# Patient Record
Sex: Female | Born: 1990 | Race: Black or African American | Hispanic: No | Marital: Single | Smoking: Never smoker
Health system: Southern US, Community
[De-identification: ages and names within clinical notes are randomized; demographics above are authoritative.]

## PROBLEM LIST (undated history)

## (undated) ENCOUNTER — Inpatient Hospital Stay (HOSPITAL_COMMUNITY): Payer: Self-pay

## (undated) DIAGNOSIS — F32A Depression, unspecified: Secondary | ICD-10-CM

## (undated) DIAGNOSIS — M1990 Unspecified osteoarthritis, unspecified site: Secondary | ICD-10-CM

## (undated) DIAGNOSIS — F329 Major depressive disorder, single episode, unspecified: Secondary | ICD-10-CM

## (undated) DIAGNOSIS — D219 Benign neoplasm of connective and other soft tissue, unspecified: Secondary | ICD-10-CM

## (undated) DIAGNOSIS — K219 Gastro-esophageal reflux disease without esophagitis: Secondary | ICD-10-CM

## (undated) DIAGNOSIS — E739 Lactose intolerance, unspecified: Secondary | ICD-10-CM

## (undated) DIAGNOSIS — G43909 Migraine, unspecified, not intractable, without status migrainosus: Secondary | ICD-10-CM

## (undated) DIAGNOSIS — A029 Salmonella infection, unspecified: Secondary | ICD-10-CM

## (undated) DIAGNOSIS — F419 Anxiety disorder, unspecified: Secondary | ICD-10-CM

## (undated) DIAGNOSIS — J45909 Unspecified asthma, uncomplicated: Secondary | ICD-10-CM

## (undated) DIAGNOSIS — B019 Varicella without complication: Secondary | ICD-10-CM

## (undated) DIAGNOSIS — K529 Noninfective gastroenteritis and colitis, unspecified: Secondary | ICD-10-CM

## (undated) DIAGNOSIS — D649 Anemia, unspecified: Secondary | ICD-10-CM

## (undated) DIAGNOSIS — T7840XA Allergy, unspecified, initial encounter: Secondary | ICD-10-CM

## (undated) DIAGNOSIS — A048 Other specified bacterial intestinal infections: Secondary | ICD-10-CM

## (undated) HISTORY — DX: Allergy, unspecified, initial encounter: T78.40XA

## (undated) HISTORY — DX: Noninfective gastroenteritis and colitis, unspecified: K52.9

## (undated) HISTORY — DX: Varicella without complication: B01.9

## (undated) HISTORY — DX: Depression, unspecified: F32.A

## (undated) HISTORY — DX: Gastro-esophageal reflux disease without esophagitis: K21.9

## (undated) HISTORY — DX: Unspecified osteoarthritis, unspecified site: M19.90

## (undated) HISTORY — DX: Major depressive disorder, single episode, unspecified: F32.9

## (undated) HISTORY — DX: Other specified bacterial intestinal infections: A04.8

## (undated) HISTORY — DX: Anxiety disorder, unspecified: F41.9

## (undated) HISTORY — DX: Anemia, unspecified: D64.9

## (undated) HISTORY — PX: OTHER SURGICAL HISTORY: SHX169

## (undated) HISTORY — DX: Lactose intolerance, unspecified: E73.9

## (undated) HISTORY — DX: Benign neoplasm of connective and other soft tissue, unspecified: D21.9

## (undated) HISTORY — DX: Migraine, unspecified, not intractable, without status migrainosus: G43.909

## (undated) HISTORY — DX: Salmonella infection, unspecified: A02.9

## (undated) HISTORY — DX: Unspecified asthma, uncomplicated: J45.909

---
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
## 2011-06-24 ENCOUNTER — Ambulatory Visit (INDEPENDENT_AMBULATORY_CARE_PROVIDER_SITE_OTHER): Payer: BC Managed Care – PPO | Admitting: Internal Medicine

## 2011-06-24 DIAGNOSIS — J301 Allergic rhinitis due to pollen: Secondary | ICD-10-CM

## 2011-06-24 DIAGNOSIS — G4700 Insomnia, unspecified: Secondary | ICD-10-CM

## 2011-07-07 DIAGNOSIS — A048 Other specified bacterial intestinal infections: Secondary | ICD-10-CM

## 2011-07-07 HISTORY — DX: Other specified bacterial intestinal infections: A04.8

## 2011-08-31 ENCOUNTER — Ambulatory Visit (INDEPENDENT_AMBULATORY_CARE_PROVIDER_SITE_OTHER): Payer: BC Managed Care – PPO | Admitting: Internal Medicine

## 2011-08-31 DIAGNOSIS — J209 Acute bronchitis, unspecified: Secondary | ICD-10-CM

## 2011-08-31 DIAGNOSIS — F418 Other specified anxiety disorders: Secondary | ICD-10-CM | POA: Insufficient documentation

## 2011-08-31 DIAGNOSIS — A048 Other specified bacterial intestinal infections: Secondary | ICD-10-CM | POA: Insufficient documentation

## 2011-08-31 DIAGNOSIS — A749 Chlamydial infection, unspecified: Secondary | ICD-10-CM

## 2011-08-31 DIAGNOSIS — F341 Dysthymic disorder: Secondary | ICD-10-CM

## 2011-08-31 DIAGNOSIS — N771 Vaginitis, vulvitis and vulvovaginitis in diseases classified elsewhere: Secondary | ICD-10-CM

## 2011-08-31 DIAGNOSIS — J301 Allergic rhinitis due to pollen: Secondary | ICD-10-CM | POA: Insufficient documentation

## 2011-08-31 DIAGNOSIS — G4700 Insomnia, unspecified: Secondary | ICD-10-CM

## 2011-08-31 DIAGNOSIS — R490 Dysphonia: Secondary | ICD-10-CM

## 2011-08-31 LAB — POCT WET PREP WITH KOH: KOH Prep POC: NEGATIVE

## 2011-08-31 MED ORDER — TRIAZOLAM 0.25 MG PO TABS: 0.2500 mg | ORAL_TABLET | Freq: Every evening | ORAL | Status: DC | PRN

## 2011-08-31 MED ORDER — PREDNISONE 20 MG PO TABS: ORAL_TABLET | ORAL | Status: DC

## 2011-08-31 MED ORDER — METRONIDAZOLE 500 MG PO TABS: 500.0000 mg | ORAL_TABLET | Freq: Two times a day (BID) | ORAL | Status: DC

## 2011-08-31 MED ORDER — BUPROPION HCL ER (XL) 150 MG PO TB24: 150.0000 mg | ORAL_TABLET | Freq: Every day | ORAL | Status: DC

## 2011-08-31 MED ORDER — AZITHROMYCIN 250 MG PO TABS: ORAL_TABLET | ORAL | Status: DC

## 2011-08-31 NOTE — Progress Notes (Signed)
  Subjective:    Patient ID: Grace Campbell, female    DOB: 1990/11/09, 20 y.o.   MRN: [id number]  HPIShe presents tonight for several problems As a followup for hoarseness w/ singing from 12/12, she was doing well with Singulair Flonase and xyzal until one week ago when she developed a cough which is productive. She has had night sweats. There is no history of asthma but she has had to use an inhaler with a past infection. There is no sinus drainage or sore throat  She also gives a history of 2 months of vaginal discharge with an odor. She denies pelvic pain and dyspareunia and near no urinary symptoms. She has a new partner for 3 months and uses condoms. Menses are regular.  She has recently seen her therapist Lorenda Cahill and both agree that it's time to try medication again for her depression with mild anxiety.It is pertinent that her first trial with Prozac caused suicidal thoughts. This is beginning to interfere with her academic performance. She has done well with triazolam for insomnia over the past 2 months.    Review of SystemsHistory of H. Pylori infection treated a few years ago. She currently has no symptoms of reflux or dyspepsia     Objective:   Physical ExamVital signs normal TMs clear Nares clear Throat clear No anterior cervical nodes Chest with rhonchi at the left base Voice is hoarse   Pelvic exam shows a thin watery discharge at the introitus/os is slightly irritated with a yellow discharge There's a small pustule at the top of the clitoral hood with scant pus drained from the site of a hair follicle Uterus is anterior and nontender There are no adnexal masses or tenderness        Assessment & Plan:  Problem #1 lower respiratory infection with reactive airway disease Zithromax 250 pack Prednisone 332211 20 milligram/6days  Problem #2 hoarseness related to allergic rhinitis and exacerbated by problem #1 Continue singulair and flonase and xyzal  Problem #3  nonspecific vaginitis Flagyl 5oo bid 7d Gen-Probe was sent to the lab /cell [phone]  Problem #4 depression with anxiety Wellbutrin 150 XL started #30 with one refill 2 followup with me in 4-6 weeks Continue therapy  Problem #5 insomnia Triazolam refilled 0.25 mg  f/u 4-6wk

## 2011-09-02 LAB — GC/CHLAMYDIA PROBE AMP, GENITAL: GC Probe Amp, Genital: NEGATIVE

## 2011-09-04 ENCOUNTER — Telehealth: Payer: Self-pay

## 2011-09-04 MED ORDER — AZITHROMYCIN 250 MG PO TABS: ORAL_TABLET | ORAL | Status: AC

## 2011-09-04 NOTE — Telephone Encounter (Signed)
Dr Merla Riches pt would like you to call her regarding illness  [phone]

## 2011-09-04 NOTE — Progress Notes (Signed)
Addended by: Tonye Pearson on: 09/04/2011 02:50 PM   Modules accepted: Orders

## 2011-09-05 NOTE — Telephone Encounter (Signed)
PT WOULD NOT GIVE ANY INFORMATION TO CLINICAL STAFF TO RELAY TO DR DOOLITTLE .  ONLY WANTS TO SPEAK TO DR Merla Riches ONLY

## 2011-09-06 NOTE — Telephone Encounter (Signed)
No answer 3/3 4pm

## 2011-09-10 ENCOUNTER — Telehealth: Payer: Self-pay

## 2011-09-10 NOTE — Telephone Encounter (Signed)
.  [facility]    PT REQUESTING OUT OF SCHOOL NOTE FOR DATES FEB 27,28 FAXED TO HER AT [phone]   BEST PHONE [phone]

## 2011-09-11 ENCOUNTER — Ambulatory Visit (INDEPENDENT_AMBULATORY_CARE_PROVIDER_SITE_OTHER): Payer: BC Managed Care – PPO | Admitting: Internal Medicine

## 2011-09-11 ENCOUNTER — Encounter: Payer: Self-pay | Admitting: *Deleted

## 2011-09-11 VITALS — BP 112/78 | HR 74 | Temp 97.6°F | Resp 18 | Ht 66.0 in | Wt 119.0 lb

## 2011-09-11 DIAGNOSIS — J45909 Unspecified asthma, uncomplicated: Secondary | ICD-10-CM

## 2011-09-11 MED ORDER — ALBUTEROL SULFATE HFA 108 (90 BASE) MCG/ACT IN AERS: 2.0000 | INHALATION_SPRAY | Freq: Four times a day (QID) | RESPIRATORY_TRACT | Status: DC | PRN

## 2011-09-11 NOTE — Telephone Encounter (Signed)
Dr Merla Riches please advise.

## 2011-09-11 NOTE — Progress Notes (Signed)
  Subjective:    Patient ID: Grace Campbell, female    DOB: 03-06-1991, 21 y.o.   MRN: [id number]  HPI Followup from last visit Partner evaluated for Chlamydia Her symptoms of bacterial vaginosis if cleared with treatment Her reactive airway disease has also responded to treatment although she still has some episodes of shortness of breath with activity and some trouble during the night she wakes up a little short of breath. No palpitations. Mild chest pain anteriorly in the ribs when she first wakes up in the morning and takes a deep breath but no other chest pain. There is no past history of asthma   Review of SystemsNo fever or night sweats  no urinary symptoms To DC for break    Objective:   Physical Exam Vital stable Lungs clear including no wheezing no forced expiratory        Assessment & Plan:  Problem #1 reactive airway disease Use albuterol 2 puffs every 4-6 hours when necessary for the next 2-3 weeks and then followup if not well

## 2011-09-11 NOTE — Telephone Encounter (Signed)
Pt is calling to find out about her note for school  Urgent needs to be done by five pm today.

## 2012-11-06 ENCOUNTER — Ambulatory Visit (INDEPENDENT_AMBULATORY_CARE_PROVIDER_SITE_OTHER): Payer: BC Managed Care – PPO | Admitting: Physician Assistant

## 2012-11-06 VITALS — BP 117/71 | HR 70 | Temp 98.3°F | Resp 16 | Ht 66.5 in | Wt 121.2 lb

## 2012-11-06 DIAGNOSIS — B9689 Other specified bacterial agents as the cause of diseases classified elsewhere: Secondary | ICD-10-CM

## 2012-11-06 DIAGNOSIS — J309 Allergic rhinitis, unspecified: Secondary | ICD-10-CM

## 2012-11-06 DIAGNOSIS — N760 Acute vaginitis: Secondary | ICD-10-CM

## 2012-11-06 DIAGNOSIS — N898 Other specified noninflammatory disorders of vagina: Secondary | ICD-10-CM

## 2012-11-06 LAB — POCT WET PREP WITH KOH: Trichomonas, UA: NEGATIVE

## 2012-11-06 MED ORDER — FLUTICASONE PROPIONATE 50 MCG/ACT NA SUSP: 2.0000 | Freq: Every day | NASAL | Status: DC

## 2012-11-06 MED ORDER — MONTELUKAST SODIUM 10 MG PO TABS: 10.0000 mg | ORAL_TABLET | Freq: Every day | ORAL | Status: DC

## 2012-11-06 MED ORDER — METRONIDAZOLE 500 MG PO TABS: 500.0000 mg | ORAL_TABLET | Freq: Two times a day (BID) | ORAL | Status: DC

## 2012-11-06 NOTE — Patient Instructions (Addendum)
Begin using the Flonase daily.  I have sent generic Singulair to your pharmacy, hopefully insurance will cover this.  If not, let's try Zyrtec (without decongestant).  Hopefully that combination will provide good relief of allergy symptoms, if not please let me know.    Begin taking the Flagyl as directed.  DO NOT CONSUME ALCOHOL WITH THIS MEDICATION or for 48 hours after your last dose.   Bacterial Vaginosis Bacterial vaginosis (BV) is a vaginal infection where the normal balance of bacteria in the vagina is disrupted. The normal balance is then replaced by an overgrowth of certain bacteria. There are several different kinds of bacteria that can cause BV. BV is the most common vaginal infection in women of childbearing age. CAUSES   The cause of BV is not fully understood. BV develops when there is an increase or imbalance of harmful bacteria.  Some activities or behaviors can upset the normal balance of bacteria in the vagina and put women at increased risk including:  Having a new sex partner or multiple sex partners.  Douching.  Using an intrauterine device (IUD) for contraception.  It is not clear what role sexual activity plays in the development of BV. However, women that have never had sexual intercourse are rarely infected with BV. Women do not get BV from toilet seats, bedding, swimming pools or from touching objects around them.  SYMPTOMS   Grey vaginal discharge.  A fish-like odor with discharge, especially after sexual intercourse.  Itching or burning of the vagina and vulva.  Burning or pain with urination.  Some women have no signs or symptoms at all. DIAGNOSIS  Your caregiver must examine the vagina for signs of BV. Your caregiver will perform lab tests and look at the sample of vaginal fluid through a microscope. They will look for bacteria and abnormal cells (clue cells), a pH test higher than 4.5, and a positive amine test all associated with BV.  RISKS AND  COMPLICATIONS   Pelvic inflammatory disease (PID).  Infections following gynecology surgery.  Developing HIV.  Developing herpes virus. TREATMENT  Sometimes BV will clear up without treatment. However, all women with symptoms of BV should be treated to avoid complications, especially if gynecology surgery is planned. Female partners generally do not need to be treated. However, BV may spread between female sex partners so treatment is helpful in preventing a recurrence of BV.   BV may be treated with antibiotics. The antibiotics come in either pill or vaginal cream forms. Either can be used with nonpregnant or pregnant women, but the recommended dosages differ. These antibiotics are not harmful to the baby.  BV can recur after treatment. If this happens, a second round of antibiotics will often be prescribed.  Treatment is important for pregnant women. If not treated, BV can cause a premature delivery, especially for a pregnant woman who had a premature birth in the past. All pregnant women who have symptoms of BV should be checked and treated.  For chronic reoccurrence of BV, treatment with a type of prescribed gel vaginally twice a week is helpful. HOME CARE INSTRUCTIONS   Finish all medication as directed by your caregiver.  Do not have sex until treatment is completed.  Tell your sexual partner that you have a vaginal infection. They should see their caregiver and be treated if they have problems, such as a mild rash or itching.  Practice safe sex. Use condoms. Only have 1 sex partner. PREVENTION  Basic prevention steps can help reduce  the risk of upsetting the natural balance of bacteria in the vagina and developing BV:  Do not have sexual intercourse (be abstinent).  Do not douche.  Use all of the medicine prescribed for treatment of BV, even if the signs and symptoms go away.  Tell your sex partner if you have BV. That way, they can be treated, if needed, to prevent  reoccurrence. SEEK MEDICAL CARE IF:   Your symptoms are not improving after 3 days of treatment.  You have increased discharge, pain, or fever. MAKE SURE YOU:   Understand these instructions.  Will watch your condition.  Will get help right away if you are not doing well or get worse. FOR MORE INFORMATION  Division of STD Prevention (DSTDP), Centers for Disease Control and Prevention: [employer] American Social Health Association (ASHA): www.ashastd.org  Document Released: 06/22/2005 Document Revised: 09/14/2011 Document Reviewed: 12/13/2008 [facility] Patient Information 2013 [place], [place].

## 2012-11-06 NOTE — Progress Notes (Signed)
Subjective:    Patient ID: Grace Campbell, female    DOB: 1991-01-03, 22 y.o.   MRN: [id number]  HPI   Grace Campbell is a 22 yr old female here with two concerns.  (1)  Vaginal discharge - started about a month ago.  Pt relates this to taking tramadol which was prescribed by ortho.  She has since stopped taking the tramadol, now taking prednisone.  Has difficulty describing the discharge but states there is more than normal.  States she has never had yeast, but has had BV several times most recently last fall.  This feels similar to previous episodes of BV.  She is sexually active with 1 female partner.  States they use condoms 100% of the time.  This is her only form of contraception.  Has considered other forms but has not made up her mind yet about what she would like to use.  LMP last week.  No concern for STI, but would like testing today.  (2)  Allergies - pt states she has "really severe upper respiratory problems."  Allergies are bothering her this time of year, experiencing runny nose, itchy throat, cough, itchy eyes.  Currently using Claritin-D only.  States she tried to get ins approval for Singulair in the past but was not able to do so.  Has used Flonase in the past.     Review of Systems  Constitutional: Negative for fever and chills.  HENT: Positive for congestion, rhinorrhea and sneezing.   Eyes: Positive for itching.  Respiratory: Positive for cough. Negative for shortness of breath and wheezing.   Cardiovascular: Negative.   Gastrointestinal: Negative.   Genitourinary: Positive for vaginal discharge. Negative for dysuria, frequency and menstrual problem.  Allergic/Immunologic: Positive for environmental allergies.  Neurological: Negative.        Objective:   Physical Exam  Vitals reviewed. Constitutional: She is oriented to person, place, and time. She appears well-developed and well-nourished. No distress.  HENT:  Head: Normocephalic and atraumatic.  Right Ear: Tympanic  membrane and ear canal normal.  Left Ear: Tympanic membrane and ear canal normal.  Mouth/Throat: Uvula is midline, oropharynx is clear and moist and mucous membranes are normal.  Eyes: Conjunctivae are normal. No scleral icterus.  Cardiovascular: Normal rate, regular rhythm and normal heart sounds.  Exam reveals no gallop and no friction rub.   No murmur heard. Pulmonary/Chest: Effort normal. She has no wheezes. She has no rales.  Abdominal: Soft. Bowel sounds are normal. There is no tenderness.  Genitourinary: Uterus normal. There is no rash, tenderness or lesion on the right labia. There is no rash, tenderness or lesion on the left labia. Cervix exhibits no motion tenderness, no discharge and no friability. Right adnexum displays no mass, no tenderness and no fullness. Left adnexum displays no mass, no tenderness and no fullness. Vaginal discharge (thick, white) found.  Neurological: She is alert and oriented to person, place, and time.  Skin: Skin is warm and dry.  Psychiatric: She has a normal mood and affect. Her behavior is normal.     Filed Vitals:   11/06/12 1539  BP: 117/71  Pulse: 70  Temp: 98.3 F (36.8 C)  Resp: 16     Results for orders placed in visit on 11/06/12  POCT WET PREP WITH KOH      Result Value Range   Trichomonas, UA Negative     Clue Cells Wet Prep HPF POC 100%     Epithelial Wet Prep HPF POC 5-7  Yeast Wet Prep HPF POC neg     Bacteria Wet Prep HPF POC neg     RBC Wet Prep HPF POC neg     WBC Wet Prep HPF POC neg     KOH Prep POC Negative         Assessment & Plan:  Bacterial vaginosis - Plan: metroNIDAZOLE (FLAGYL) 500 MG tablet, DISCONTINUED: metroNIDAZOLE (FLAGYL) 500 MG tablet  Vaginal discharge - Plan: POCT Wet Prep with KOH, GC/Chlamydia Probe Amp  Allergic rhinitis - Plan: montelukast (SINGULAIR) 10 MG tablet, fluticasone (FLONASE) 50 MCG/ACT nasal spray, DISCONTINUED: fluticasone (FLONASE) 50 MCG/ACT nasal spray, DISCONTINUED:  montelukast (SINGULAIR) 10 MG tablet   Grace Campbell is a 22 yr old female here with bacterial vaginosis and allergic rhinitis.  Will treat BV with Flagyl x 7 days, discussed avoidance of etoh.  Will restart Flonase and Singular today.  If ins does not cover this, can try Zyrtec or Xyzal.  Encouraged pt to let me know how allergy symptoms are responding to treatment as we can make adjustments to optimize therapy.  Pt understands and is in agreement with this plan.     After medications were sent to pharmacy, pt requested a printed copy.  Phoned the pharmacy and cancelled e-scribed medications.  Printed rx provided

## 2012-11-08 LAB — GC/CHLAMYDIA PROBE AMP: GC Probe RNA: NEGATIVE

## 2012-12-12 ENCOUNTER — Ambulatory Visit (INDEPENDENT_AMBULATORY_CARE_PROVIDER_SITE_OTHER): Payer: BC Managed Care – PPO | Admitting: Family Medicine

## 2012-12-12 VITALS — BP 123/80 | HR 91 | Temp 98.0°F | Resp 16 | Ht 66.0 in | Wt 124.4 lb

## 2012-12-12 DIAGNOSIS — N912 Amenorrhea, unspecified: Secondary | ICD-10-CM

## 2012-12-12 LAB — POCT URINE PREGNANCY: Preg Test, Ur: POSITIVE

## 2012-12-12 NOTE — Progress Notes (Signed)
[facility] [address] [place] [address] [phone]  Date:  12/12/2012   Name:  Grace Campbell   DOB:  06-28-1991   MRN:  [id number]  PCP:  No primary provider on file.    Chief Complaint: Possible Pregnancy   History of Present Illness:  Grace Campbell is a 22 y.o. very pleasant female patient who presents with the following:  LMP 10/24/12.  She is here today with a positive home HCG test.   She has been pregnant twice in the past- she does not have any children.  She is currently taking singulair and tramadol, and is generally healthy   Patient Active Problem List   Diagnosis Date Noted  . Depression with anxiety 08/31/2011  . Insomnia 08/31/2011  . Allergic rhinitis due to pollen 08/31/2011  . H. pylori infection 08/31/2011    History reviewed. No pertinent past medical history.  History reviewed. No pertinent past surgical history.  History  Substance Use Topics  . Smoking status: Never Smoker   . Smokeless tobacco: Not on file  . Alcohol Use: Yes    History reviewed. No pertinent family history.  Allergies  Allergen Reactions  . Prozac (Fluoxetine Hcl) Other (See Comments)    Suicidal thoughts    Medication list has been reviewed and updated.  Current Outpatient Prescriptions on File Prior to Visit  Medication Sig Dispense Refill  . fluticasone (FLONASE) 50 MCG/ACT nasal spray Place 2 sprays into the nose daily.  16 g  12  . montelukast (SINGULAIR) 10 MG tablet Take 1 tablet (10 mg total) by mouth at bedtime.  30 tablet  3  . buPROPion (WELLBUTRIN SR) 150 MG 12 hr tablet Take 150 mg by mouth 2 (two) times daily.      . metroNIDAZOLE (FLAGYL) 500 MG tablet Take 1 tablet (500 mg total) by mouth 2 (two) times daily with a meal. DO NOT CONSUME ALCOHOL WHILE TAKING THIS MEDICATION.  14 tablet  0  . predniSONE (DELTASONE) 10 MG tablet Take 10 mg by mouth daily.       No current facility-administered medications on file prior to  visit.    Review of Systems:  As per HPI- otherwise negative.   Physical Examination: Filed Vitals:   12/12/12 1337  BP: 123/80  Pulse: 91  Temp: 98 F (36.7 C)  Resp: 16   Filed Vitals:   12/12/12 1337  Height: 5\' 6"  (1.676 m)  Weight: 124 lb 6.4 oz (56.427 kg)   Body mass index is 20.09 kg/(m^2). Ideal Body Weight: Weight in (lb) to have BMI = 25: 154.6  GEN: WDWN, NAD, Non-toxic, A & O x 3 HEENT: Atraumatic, Normocephalic. Neck supple. No masses, No LAD. Ears and Nose: No external deformity. CV: RRR, No M/G/R. No JVD. No thrill. No extra heart sounds. PULM: CTA B, no wheezes, crackles, rhonchi. No retractions. No resp. distress. No accessory muscle use. ABD: S, ND, +BS. No rebound. No HSM.  She notes mild tenderness in her lower abdomen.  No guarding EXTR: No c/c/e NEURO Normal gait.  PSYCH: Normally interactive. Conversant. Not depressed or anxious appearing.  Calm demeanor.    Results for orders placed in visit on 12/12/12  POCT URINE PREGNANCY      Result Value Range   Preg Test, Ur Positive      Assessment and Plan: Amenorrhea - Plan: POCT urine pregnancy   Grace Campbell has a positive pregnancy test today.  She notes abdominal  discomfort and has not yet had a confirmed IUP.  Referral to [facility] for further evaluation.  She is not yet sure what she plans to do with this pregnancy.   Signed Abbe Amsterdam, MD

## 2012-12-12 NOTE — Patient Instructions (Signed)
You are [redacted] weeks pregnant- your due date would be 07/31/2013 going by your last period.  Because you are having pain we need to have you seen at the [facility] to ensure that the pregnancy is in your uterus.

## 2012-12-23 ENCOUNTER — Encounter (HOSPITAL_COMMUNITY): Payer: Self-pay

## 2012-12-23 ENCOUNTER — Inpatient Hospital Stay (HOSPITAL_COMMUNITY)
Admission: AD | Admit: 2012-12-23 | Discharge: 2012-12-23 | Disposition: A | Discharge status: Home / Self Care | Payer: BC Managed Care – PPO | Source: Ambulatory Visit | Attending: Obstetrics & Gynecology | Admitting: Obstetrics & Gynecology

## 2012-12-23 ENCOUNTER — Inpatient Hospital Stay (HOSPITAL_COMMUNITY): Payer: BC Managed Care – PPO

## 2012-12-23 DIAGNOSIS — R109 Unspecified abdominal pain: Secondary | ICD-10-CM | POA: Insufficient documentation

## 2012-12-23 DIAGNOSIS — O99891 Other specified diseases and conditions complicating pregnancy: Secondary | ICD-10-CM | POA: Insufficient documentation

## 2012-12-23 DIAGNOSIS — O9989 Other specified diseases and conditions complicating pregnancy, childbirth and the puerperium: Secondary | ICD-10-CM

## 2012-12-23 DIAGNOSIS — N949 Unspecified condition associated with female genital organs and menstrual cycle: Secondary | ICD-10-CM | POA: Insufficient documentation

## 2012-12-23 LAB — URINALYSIS, ROUTINE W REFLEX MICROSCOPIC
Hgb urine dipstick: NEGATIVE
Specific Gravity, Urine: 1.025 (ref 1.005–1.030)
Urobilinogen, UA: 0.2 mg/dL (ref 0.0–1.0)
pH: 6 (ref 5.0–8.0)

## 2012-12-23 LAB — CBC
HCT: 35.8 % — ABNORMAL LOW (ref 36.0–46.0)
Hemoglobin: 12.2 g/dL (ref 12.0–15.0)
MCH: 31.7 pg (ref 26.0–34.0)
RBC: 3.85 MIL/uL — ABNORMAL LOW (ref 3.87–5.11)

## 2012-12-23 LAB — ABO/RH: ABO/RH(D): A POS

## 2012-12-23 LAB — WET PREP, GENITAL: Yeast Wet Prep HPF POC: NONE SEEN

## 2012-12-23 NOTE — MAU Provider Note (Signed)
History     [name]: [id number]  Arrival date and time: 12/23/12 1316   None     Chief Complaint  Patient presents with  . Abdominal Pain   HPI  Grace Campbell is a 22 y.o. G1P0 at [redacted]w[redacted]d who presents today with cramping and "sharp" pains. She denies any bleeding. She has not started [facility] at this time, but has an appointment with [facility] on July 10th.   No past medical history on file.  No past surgical history on file.  No family history on file.  History  Substance Use Topics  . Smoking status: Never Smoker   . Smokeless tobacco: Not on file  . Alcohol Use: Yes    Allergies:  Allergies  Allergen Reactions  . Prozac (Fluoxetine Hcl) Other (See Comments)    Suicidal thoughts    Prescriptions prior to admission  Medication Sig Dispense Refill  . buPROPion (WELLBUTRIN SR) 150 MG 12 hr tablet Take 150 mg by mouth 2 (two) times daily.      . fluticasone (FLONASE) 50 MCG/ACT nasal spray Place 2 sprays into the nose daily.  16 g  12  . metroNIDAZOLE (FLAGYL) 500 MG tablet Take 1 tablet (500 mg total) by mouth 2 (two) times daily with a meal. DO NOT CONSUME ALCOHOL WHILE TAKING THIS MEDICATION.  14 tablet  0  . montelukast (SINGULAIR) 10 MG tablet Take 1 tablet (10 mg total) by mouth at bedtime.  30 tablet  3  . predniSONE (DELTASONE) 10 MG tablet Take 10 mg by mouth daily.      . traMADol (ULTRAM) 50 MG tablet Take 50 mg by mouth every 6 (six) hours as needed for pain.        Review of Systems  Constitutional: Negative for fever and chills.  Eyes: Negative for blurred vision.  Respiratory: Negative for shortness of breath.   Cardiovascular: Negative for chest pain.  Gastrointestinal: Positive for abdominal pain ("cramps"). Negative for nausea, vomiting, diarrhea and constipation.  Genitourinary: Negative for dysuria, urgency and frequency.  Musculoskeletal: Negative for myalgias.  Neurological: Negative for dizziness and headaches.   Physical Exam   Blood pressure 133/94,  pulse 77, temperature 98.5 F (36.9 C), temperature source Oral, resp. rate 16, height 5\' 5"  (1.651 m), weight 55.43 kg (122 lb 3.2 oz), last menstrual period 10/22/2012, SpO2 100.00%.  Physical Exam  Nursing note and vitals reviewed. Constitutional: She appears well-developed and well-nourished. No distress.  Cardiovascular: Normal rate.   Respiratory: Effort normal.  GI: Soft. There is no tenderness.  Genitourinary:   External: no lesion Vagina: small amount of white discharge Cervix: pink, smooth, no CMT Uterus: AGA Adnexa: NT     MAU Course  Procedures  Results for orders placed during the hospital encounter of 12/23/12 (from the past 24 hour(s))  CBC     Status: Abnormal   Collection Time    12/23/12  1:40 PM      Result Value Range   WBC 5.7  4.0 - 10.5 K/uL   RBC 3.85 (*) 3.87 - 5.11 MIL/uL   Hemoglobin 12.2  12.0 - 15.0 g/dL   HCT 78.2 (*) 95.6 - 21.3 %   MCV 93.0  78.0 - 100.0 fL   MCH 31.7  26.0 - 34.0 pg   MCHC 34.1  30.0 - 36.0 g/dL   RDW 08.6  57.8 - 46.9 %   Platelets 292  150 - 400 K/uL  ABO/RH     Status: None  Collection Time    12/23/12  1:40 PM      Result Value Range   ABO/RH(D) A POS    HCG, QUANTITATIVE, PREGNANCY     Status: Abnormal   Collection Time    12/23/12  1:40 PM      Result Value Range   hCG, Beta Chain, Quant, S 17355 (*) <5 mIU/mL  URINALYSIS, ROUTINE W REFLEX MICROSCOPIC     Status: None   Collection Time    12/23/12  2:10 PM      Result Value Range   Color, Urine YELLOW  YELLOW   APPearance CLEAR  CLEAR   Specific Gravity, Urine 1.025  1.005 - 1.030   pH 6.0  5.0 - 8.0   Glucose, UA NEGATIVE  NEGATIVE mg/dL   Hgb urine dipstick NEGATIVE  NEGATIVE   Bilirubin Urine NEGATIVE  NEGATIVE   Ketones, ur NEGATIVE  NEGATIVE mg/dL   Protein, ur NEGATIVE  NEGATIVE mg/dL   Urobilinogen, UA 0.2  0.0 - 1.0 mg/dL   Nitrite NEGATIVE  NEGATIVE   Leukocytes, UA NEGATIVE  NEGATIVE      Results for orders placed during the hospital  encounter of 12/23/12 (from the past 24 hour(s))  CBC     Status: Abnormal   Collection Time    12/23/12  1:40 PM      Result Value Range   WBC 5.7  4.0 - 10.5 K/uL   RBC 3.85 (*) 3.87 - 5.11 MIL/uL   Hemoglobin 12.2  12.0 - 15.0 g/dL   HCT 16.1 (*) 09.6 - 04.5 %   MCV 93.0  78.0 - 100.0 fL   MCH 31.7  26.0 - 34.0 pg   MCHC 34.1  30.0 - 36.0 g/dL   RDW 40.9  81.1 - 91.4 %   Platelets 292  150 - 400 K/uL  ABO/RH     Status: None   Collection Time    12/23/12  1:40 PM      Result Value Range   ABO/RH(D) A POS    HCG, QUANTITATIVE, PREGNANCY     Status: Abnormal   Collection Time    12/23/12  1:40 PM      Result Value Range   hCG, Beta Chain, Quant, S [id number] (*) <5 mIU/mL  URINALYSIS, ROUTINE W REFLEX MICROSCOPIC     Status: None   Collection Time    12/23/12  2:10 PM      Result Value Range   Color, Urine YELLOW  YELLOW   APPearance CLEAR  CLEAR   Specific Gravity, Urine 1.025  1.005 - 1.030   pH 6.0  5.0 - 8.0   Glucose, UA NEGATIVE  NEGATIVE mg/dL   Hgb urine dipstick NEGATIVE  NEGATIVE   Bilirubin Urine NEGATIVE  NEGATIVE   Ketones, ur NEGATIVE  NEGATIVE mg/dL   Protein, ur NEGATIVE  NEGATIVE mg/dL   Urobilinogen, UA 0.2  0.0 - 1.0 mg/dL   Nitrite NEGATIVE  NEGATIVE   Leukocytes, UA NEGATIVE  NEGATIVE  WET PREP, GENITAL     Status: Abnormal   Collection Time    12/23/12  4:00 PM      Result Value Range   Yeast Wet Prep HPF POC NONE SEEN  NONE SEEN   Trich, Wet Prep NONE SEEN  NONE SEEN   Clue Cells Wet Prep HPF POC NONE SEEN  NONE SEEN   WBC, Wet Prep HPF POC FEW (*) NONE SEEN    Assessment and Plan   1. Pelvic  pain complicating pregnancy, antepartum, first trimester    First trimester danger signs reviewed Start [facility] a scheduled Return to MAU as needed  Tawnya Crook 12/23/2012, 3:07 PM

## 2012-12-23 NOTE — MAU Note (Signed)
Patient states she has been having intermittent abdominal pain that switches from side to side for about 5 weeks. Denies bleeding or discharge. Patient states she has nausea and vomiting about 2-3 times a day. Is able to keep food down.

## 2012-12-24 LAB — GC/CHLAMYDIA PROBE AMP: CT Probe RNA: NEGATIVE

## 2012-12-24 NOTE — MAU Provider Note (Signed)
Attestation of Attending Supervision of Advanced Practitioner (CNM/NP): Evaluation and management procedures were performed by the Advanced Practitioner under my supervision and collaboration. I have reviewed the Advanced Practitioner's note and chart, and I agree with the management and plan.  Jairon Ripberger H. 5:22 AM

## 2012-12-28 ENCOUNTER — Telehealth: Payer: Self-pay

## 2012-12-28 NOTE — Telephone Encounter (Signed)
Called her back- no answer, so LMOM.  I do not see where she has gone to the hospital as of yet.  I also encourage her to do so, as Amy said.  Let us know if there is anything else we can do to help.

## 2012-12-28 NOTE — Telephone Encounter (Signed)
Patient wants to talk with Dr. Patsy Lager regarding her  Pregnancy  [phone]

## 2012-12-28 NOTE — Telephone Encounter (Signed)
Dr Patsy Lager not in office today called patient, she was having bleeding today. She wants to know if this is normal I advised her it is not normal and [facility] To you [facility]

## 2013-01-04 ENCOUNTER — Other Ambulatory Visit: Payer: Self-pay | Admitting: Internal Medicine

## 2013-01-13 ENCOUNTER — Ambulatory Visit (INDEPENDENT_AMBULATORY_CARE_PROVIDER_SITE_OTHER): Payer: BC Managed Care – PPO | Admitting: Family Medicine

## 2013-01-13 VITALS — BP 100/62 | HR 66 | Temp 97.7°F | Resp 18 | Ht 66.0 in | Wt 120.0 lb

## 2013-01-13 DIAGNOSIS — J9801 Acute bronchospasm: Secondary | ICD-10-CM

## 2013-01-13 DIAGNOSIS — L738 Other specified follicular disorders: Secondary | ICD-10-CM

## 2013-01-13 DIAGNOSIS — J309 Allergic rhinitis, unspecified: Secondary | ICD-10-CM

## 2013-01-13 DIAGNOSIS — L259 Unspecified contact dermatitis, unspecified cause: Secondary | ICD-10-CM

## 2013-01-13 DIAGNOSIS — L678 Other hair color and hair shaft abnormalities: Secondary | ICD-10-CM

## 2013-01-13 DIAGNOSIS — L309 Dermatitis, unspecified: Secondary | ICD-10-CM

## 2013-01-13 DIAGNOSIS — L739 Follicular disorder, unspecified: Secondary | ICD-10-CM

## 2013-01-13 MED ORDER — ALBUTEROL SULFATE HFA 108 (90 BASE) MCG/ACT IN AERS: 2.0000 | INHALATION_SPRAY | Freq: Four times a day (QID) | RESPIRATORY_TRACT | Status: DC | PRN

## 2013-01-13 MED ORDER — TRIAMCINOLONE ACETONIDE 0.1 % EX CREA: TOPICAL_CREAM | Freq: Two times a day (BID) | CUTANEOUS | Status: DC

## 2013-01-13 MED ORDER — FLUTICASONE PROPIONATE 50 MCG/ACT NA SUSP: 2.0000 | Freq: Every day | NASAL | Status: DC

## 2013-01-13 MED ORDER — DOXYCYCLINE HYCLATE 100 MG PO TABS: 100.0000 mg | ORAL_TABLET | Freq: Two times a day (BID) | ORAL | Status: DC

## 2013-01-13 NOTE — Patient Instructions (Addendum)
Use the albuterol inhaler when needed for wheezing or shortness of breath  Do lung exercises, working on deep breathing. Consider some more regular physical exercise.  Use the nose spray 2 sprays each nostril at bedtime to try and reduce postnasal drainage  Drink plenty of liquids to keep secretions thin  Use the cream on the rash, as well as taking the doxycycline one twice daily for 10 days.

## 2013-01-13 NOTE — Progress Notes (Signed)
Subjective: 22 year old lady who attends college at [employer] She has a couple of problems. She needs medicines refilled. She has been having problems with some wheezing and coughing, especially at nighttime. Has postnasal drainage. She has been on fluticasone and albuterol. She is out of the albuterol. She also has a rash, primarily on her right arm, but on both arms and some scattered elsewhere on her body. These are little follicular bumps, solve them a little more diffusely erythematous. They itched quite a lot. None are draining. She has been treated in the past for what sounds like a folliculitis of the skin.  Objective: Rash as noted above. The her throat is clear. Neck supple without nodes. Chest clear. Heart regular without murmurs. Rashes noted.  Assessment: Eczematoid rash with some folliculitis Low-grade bronchospasm Probable allergic rhinitis with postnasal drainage  Plan: Triamcinolone cream and doxycycline for her rash. Cautioned her about excessive sun exposure while on the doxycycline. Use the inhaler on a when necessary basis. Advised using the nasal spray 2 sprays every night. Return if worse. Try and get more regular exercise and work on deep breathing. Peak flow did maximize at 470, but about 4 or 5 other trial test the lower than that as she does not seem to take very good deep breaths.

## 2013-03-02 ENCOUNTER — Ambulatory Visit (INDEPENDENT_AMBULATORY_CARE_PROVIDER_SITE_OTHER): Payer: BC Managed Care – PPO | Admitting: Family Medicine

## 2013-03-02 VITALS — BP 118/66 | HR 64 | Temp 98.1°F | Resp 18 | Ht 66.0 in | Wt 119.2 lb

## 2013-03-02 DIAGNOSIS — R0602 Shortness of breath: Secondary | ICD-10-CM

## 2013-03-02 DIAGNOSIS — R112 Nausea with vomiting, unspecified: Secondary | ICD-10-CM

## 2013-03-02 DIAGNOSIS — J9801 Acute bronchospasm: Secondary | ICD-10-CM

## 2013-03-02 DIAGNOSIS — R109 Unspecified abdominal pain: Secondary | ICD-10-CM

## 2013-03-02 DIAGNOSIS — J45909 Unspecified asthma, uncomplicated: Secondary | ICD-10-CM

## 2013-03-02 LAB — POCT UA - MICROSCOPIC ONLY: Crystals, Ur, HPF, POC: NEGATIVE

## 2013-03-02 LAB — POCT URINALYSIS DIPSTICK
Bilirubin, UA: NEGATIVE
Glucose, UA: NEGATIVE
Leukocytes, UA: NEGATIVE
Protein, UA: NEGATIVE

## 2013-03-02 LAB — POCT URINE PREGNANCY: Preg Test, Ur: NEGATIVE

## 2013-03-02 LAB — POCT WET PREP WITH KOH
KOH Prep POC: NEGATIVE
Trichomonas, UA: NEGATIVE

## 2013-03-02 MED ORDER — ALBUTEROL SULFATE HFA 108 (90 BASE) MCG/ACT IN AERS: 2.0000 | INHALATION_SPRAY | Freq: Four times a day (QID) | RESPIRATORY_TRACT | Status: DC | PRN

## 2013-03-02 MED ORDER — ONDANSETRON 8 MG PO TBDP: 8.0000 mg | ORAL_TABLET | Freq: Three times a day (TID) | ORAL | Status: DC | PRN

## 2013-03-02 MED ORDER — AZELASTINE HCL 0.1 % NA SOLN: 2.0000 | Freq: Two times a day (BID) | NASAL | Status: DC

## 2013-03-02 NOTE — Patient Instructions (Addendum)
1.  Return in two months for follow-up of shortness of breath, cough. 2.  Recommend bland diet for the next 72 hours; increase fluid intake.  3.  Return for worsening abdominal pain or persistent vomiting. 4. Use inhaler before activity.  Keep a log of how often you use inhaler.

## 2013-03-02 NOTE — Progress Notes (Signed)
Subjective:    Patient ID: Grace Campbell, female    DOB: 1991-03-10, 22 y.o.   MRN: [id number]  HPI Has had SOB at night, when lying down, and when walking to class, less cough. Nonproductive. No wheezing. Takes singular most days. Had bronchitis 1/13 and breathing has not been the same since. Has not had an inhaler for a year. Came in 7/14 for refill of albuterol, pharmacy told her that insurance would not pay. When talking about this further with patient's mother, she reports that they chose not to get Albuterol due to $40 copay.  Abdominal pain since Monday.Started vomiting on Monday, not able to keep anything down. [facility] and was told it was likely food poisoning. Has only had a salad since then, not able to keep down. Drinking gatorade, water and ginger ale. Still vomiting, but able to keep down a cracker. Drank 40 oz gatorade today. Last vomiting today at 4 am. Has urinated 4x today. Yesterday vomited 3-4 times. Tuesday first day of vomiting and vomited all day long. Abdominal pain currently mild. Started as sharp in right lower quadrant, spread across abdomen. May have had a fever Monday, felt hot, had chills, didn't take temperature.   No dysuria, ho hematuria, some back pain, usu. With menses. Started period Monday night.  No diarrhea. Last BM yesterday morning.  Not currently sexually active. No vaginal discharge or pain. Upon review of the chart, it is noted that 6/14, patient was [redacted] weeks pregnant by ultrasound.  Headaches off and on, nothing unusual for her.   Traveled to [place] and the [place] since onset of SOB.  Review of Systems    Objective:   Physical Exam  Constitutional: She is oriented to person, place, and time. She appears well-developed. No distress.  thin  HENT:  Right Ear: Tympanic membrane, external ear and ear canal normal.  Left Ear: Tympanic membrane, external ear and ear canal normal.  Nose: Mucosal edema present.  Mouth/Throat:  Oropharyngeal exudate present.  Eyes: Conjunctivae are normal. Pupils are equal, round, and reactive to light.  Neck: Normal range of motion. Neck supple. No thyromegaly present.  Cardiovascular: Normal rate and normal heart sounds.   Pulmonary/Chest: Effort normal and breath sounds normal.  Abdominal: Soft. She exhibits no mass. There is no tenderness. There is no rebound and no guarding.  Hyperactive bowel sounds.   Genitourinary: Uterus normal. There is no rash, tenderness or lesion on the right labia. There is no rash, tenderness or lesion on the left labia. Cervix exhibits no motion tenderness and no friability. Right adnexum displays no mass, no tenderness and no fullness. Left adnexum displays no mass, no tenderness and no fullness. No erythema or tenderness around the vagina.  Small amount of bloody drainage from cervix.  Lymphadenopathy:    She has no cervical adenopathy.  Neurological: She is alert and oriented to person, place, and time.  Skin: Skin is warm and dry.   Results for orders placed in visit on 03/02/13  POCT UA - MICROSCOPIC ONLY      Result Value Range   WBC, Ur, HPF, POC 0-1     RBC, urine, microscopic 0-2     Bacteria, U Microscopic 1+     Mucus, UA large     Epithelial cells, urine per micros 0-2     Crystals, Ur, HPF, POC neg     Casts, Ur, LPF, POC neg     Yeast, UA neg    POCT  URINALYSIS DIPSTICK      Result Value Range   Color, UA yellow     Clarity, UA cloudy     Glucose, UA neg     Bilirubin, UA neg     Ketones, UA trace     Spec Grav, UA 1.025     Blood, UA neg     pH, UA 7.0     Protein, UA neg     Urobilinogen, UA 1.0     Nitrite, UA neg     Leukocytes, UA Negative    POCT WET PREP WITH KOH      Result Value Range   Trichomonas, UA Negative     Clue Cells Wet Prep HPF POC 0-1     Epithelial Wet Prep HPF POC 3-8     Yeast Wet Prep HPF POC neg     Bacteria Wet Prep HPF POC 2+     RBC Wet Prep HPF POC 8-16     WBC Wet Prep HPF POC 0-3      KOH Prep POC Negative         Assessment & Plan:  Abdominal  pain, other specified site - Plan: POCT Wet Prep with KOH, GC/Chlamydia Probe Amp  Nausea with vomiting - Plan: POCT UA - Microscopic Only, POCT urinalysis dipstick, POCT urine pregnancy  1- Nausea/jvomiting/ abdominal pain- given Ondansetron 8 mg ODT in office and prescription. 2-Reactive airway disease- continue Singulair, Albuterol (printed prescription given so she can compare cost)., Follow up with Dr.Copeland in 2 months.

## 2013-03-02 NOTE — Progress Notes (Signed)
   [address]   [place], [place]  [address]   [phone]  Subjective:    Patient ID: Grace Campbell, female    DOB: 06-08-1991, 22 y.o.   MRN: [id number]  HPI This 22 y.o. female presents for evaluation of abdominal pain, nausea with vomiting.  History and physical examination obtained with Deboraha Sprang, NP.  l.   Review of Systems  Constitutional: Positive for fever and fatigue. Negative for chills and diaphoresis.  Respiratory: Positive for cough and shortness of breath. Negative for wheezing and stridor.   Cardiovascular: Negative for chest pain and leg swelling.  Gastrointestinal: Positive for nausea, vomiting and abdominal pain. Negative for diarrhea, constipation, blood in stool, abdominal distention, anal bleeding and rectal pain.  Genitourinary: Negative for dysuria, urgency, frequency, hematuria, flank pain, vaginal discharge and pelvic pain.  Neurological: Negative for dizziness, light-headedness and headaches.       Objective:   Physical Exam  CV: RRR: no m/g/r.  Lungs: CTAB; no wheezing; good air movement.  Abdomen:  BSNA; NT; ND; no HSM.  Pelvic: cervix with scant blood and vaginal discharge; no CMT; no ovarian TTP or masses/fullness.  Psych: flat affect. OP: MM moist. Neck: no cervical LAD; no thyromegaly.  Labs reviewed in detai     Assessment & Plan:  Abdominal  pain, other specified site - Plan: POCT Wet Prep with KOH, GC/Chlamydia Probe Amp  Nausea with vomiting - Plan: POCT UA - Microscopic Only, POCT urinalysis dipstick, POCT urine pregnancy  Shortness of breath  Reactive airway disease  Cough due to bronchospasm - Plan: albuterol (VENTOLIN HFA) 108 (90 BASE) MCG/ACT inhaler  1.  Abdominal pain: New. Associated with nausea, vomiting. Benign abdominal exam; pain has improved over the past few days.  RTC for acute worsening. 2.  Nausea with vomiting: New. Onset four days ago. Last vomiting > 12 hours ago. BRAT diet, hydration.  Rx for Zofran provided.  RTC  for persistent or worsening symptoms. 3.  SOB: Persistent; non-compliance with Albuterol for the past year.  Mother requesting trial of Albuterol and declined CXR.  Rx for Albuterol provided.   Rx for Astelin nasal spray also provided to treat allergic rhinitis which may be triggering asthma symptoms.  Recommend close follow-up in two months with PCP to evaluate asthma/SOB.  4.  Allergic Rhinitis: uncontrolled; rx for Astelin nasal spray provided.  May be trigger to asthma.   5. Asthma: uncontrolled due to non-compliance with Albuterol; rx for Albuterol provided; recommend close follow-up in two months with PCP to reevaluate symptoms.  Meds ordered this encounter  Medications  . albuterol (VENTOLIN HFA) 108 (90 BASE) MCG/ACT inhaler    Sig: Inhale 2 puffs into the lungs every 6 (six) hours as needed for wheezing.    Dispense:  18 g    Refill:  2  . ondansetron (ZOFRAN-ODT) 8 MG disintegrating tablet    Sig: Take 1 tablet (8 mg total) by mouth every 8 (eight) hours as needed for nausea.    Dispense:  30 tablet    Refill:  0  . azelastine (ASTELIN) 137 MCG/SPRAY nasal spray    Sig: Place 2 sprays into the nose 2 (two) times daily. Use in each nostril as directed    Dispense:  30 mL    Refill:  12

## 2013-03-04 LAB — GC/CHLAMYDIA PROBE AMP: GC Probe RNA: NEGATIVE

## 2013-04-10 ENCOUNTER — Ambulatory Visit (INDEPENDENT_AMBULATORY_CARE_PROVIDER_SITE_OTHER): Payer: BC Managed Care – PPO | Admitting: Emergency Medicine

## 2013-04-10 VITALS — BP 110/70 | HR 80 | Temp 98.4°F | Resp 20 | Ht 66.25 in | Wt 122.2 lb

## 2013-04-10 DIAGNOSIS — R509 Fever, unspecified: Secondary | ICD-10-CM

## 2013-04-10 DIAGNOSIS — J029 Acute pharyngitis, unspecified: Secondary | ICD-10-CM

## 2013-04-10 DIAGNOSIS — J309 Allergic rhinitis, unspecified: Secondary | ICD-10-CM

## 2013-04-10 DIAGNOSIS — J019 Acute sinusitis, unspecified: Secondary | ICD-10-CM

## 2013-04-10 LAB — POCT CBC
HCT, POC: 37.2 % — AB (ref 37.7–47.9)
Hemoglobin: 11.8 g/dL — AB (ref 12.2–16.2)
Lymph, poc: 2.2 (ref 0.6–3.4)
MCH, POC: 31.6 pg — AB (ref 27–31.2)
MCHC: 31.7 g/dL — AB (ref 31.8–35.4)
MCV: 99.4 fL — AB (ref 80–97)
POC Granulocyte: 3.7 (ref 2–6.9)
POC LYMPH PERCENT: 33.9 %L (ref 10–50)
POC MID %: 7.8 %M (ref 0–12)
Platelet Count, POC: 254 10*3/uL (ref 142–424)
RDW, POC: 12.7 %
WBC: 6.4 10*3/uL (ref 4.6–10.2)

## 2013-04-10 LAB — POCT INFLUENZA A/B
Influenza A, POC: NEGATIVE
Influenza B, POC: NEGATIVE

## 2013-04-10 MED ORDER — MONTELUKAST SODIUM 10 MG PO TABS: 10.0000 mg | ORAL_TABLET | Freq: Every day | ORAL | Status: DC

## 2013-04-10 MED ORDER — CEFDINIR 300 MG PO CAPS: 600.0000 mg | ORAL_CAPSULE | Freq: Every day | ORAL | Status: DC

## 2013-04-10 NOTE — Progress Notes (Addendum)
Subjective:    Patient ID: Grace Campbell, female    DOB: [birth year], 22 y.o.   MRN: [id number]  HPI HPI Comments: Grace Campbell is a 22 y.o. female who presents to the [facility] complaining of constant, gradually worsening, cough/congestion, onset 1 week ago. She states thought at first it was allergies to she tried Singulair w/out relief. She had fever and chills/sweats this AM (max fever 102), myalgias, HA. She states a yellow colored productive cough and feels congested w/rhinorrhea. She is unsure of any sick contacts. No hx of mono. No allergies besides certain fruits.     Review of Systems  Constitutional: Positive for fever and chills.  HENT: Positive for congestion and rhinorrhea. Negative for neck pain.   Respiratory: Positive for cough. Negative for shortness of breath.   Gastrointestinal: Negative for nausea, vomiting, abdominal pain and diarrhea.  Genitourinary: Positive for frequency.  Musculoskeletal: Negative for back pain.  Neurological: Negative for weakness.  All other systems reviewed and are negative.       Objective:   Physical Exam  Nursing note and vitals reviewed. Constitutional: She is oriented to person, place, and time. She appears well-developed and well-nourished. No distress.  HENT:  Head: Normocephalic and atraumatic.  Turbinates are blue and swollen. Throat is slightly red.   Eyes: EOM are normal.  Neck: Neck supple. No tracheal deviation present.  Cardiovascular: Normal rate, regular rhythm and normal heart sounds.   No murmur heard. Pulmonary/Chest: Effort normal and breath sounds normal. No respiratory distress. She has no wheezes. She has no rales.  Abdominal: There is no tenderness.  No hepatosplenomegaly  Musculoskeletal: Normal range of motion.  Lymphadenopathy:    She has cervical adenopathy (mild anterior and posterior cervical lymphadenopathy).  Neurological: She is alert and oriented to person, place, and time.  Skin: Skin is warm and  dry.  Psychiatric: She has a normal mood and affect. Her behavior is normal.   Results for orders placed in visit on 04/10/13  POCT RAPID STREP A (OFFICE)      Result Value Range   Rapid Strep A Screen Negative  Negative  POCT CBC      Result Value Range   WBC 6.4  4.6 - 10.2 K/uL   Lymph, poc 2.2  0.6 - 3.4   POC LYMPH PERCENT 33.9  10 - 50 %L   MID (cbc) 0.5  0 - 0.9   POC MID % 7.8  0 - 12 %M   POC Granulocyte 3.7  2 - 6.9   Granulocyte percent 58.3  37 - 80 %G   RBC 3.74 (*) 4.04 - 5.48 M/uL   Hemoglobin 11.8 (*) 12.2 - 16.2 g/dL   HCT, POC 40.9 (*) 81.1 - 47.9 %   MCV 99.4 (*) 80 - 97 fL   MCH, POC 31.6 (*) 27 - 31.2 pg   MCHC 31.7 (*) 31.8 - 35.4 g/dL   RDW, POC 91.4     Platelet Count, POC 254  142 - 424 K/uL   MPV 7.9  0 - 99.8 fL   Results for orders placed in visit on 04/10/13  POCT RAPID STREP A (OFFICE)      Result Value Range   Rapid Strep A Screen Negative  Negative  POCT CBC      Result Value Range   WBC 6.4  4.6 - 10.2 K/uL   Lymph, poc 2.2  0.6 - 3.4   POC LYMPH PERCENT 33.9  10 - 50 %L  MID (cbc) 0.5  0 - 0.9   POC MID % 7.8  0 - 12 %M   POC Granulocyte 3.7  2 - 6.9   Granulocyte percent 58.3  37 - 80 %G   RBC 3.74 (*) 4.04 - 5.48 M/uL   Hemoglobin 11.8 (*) 12.2 - 16.2 g/dL   HCT, POC 16.1 (*) 09.6 - 47.9 %   MCV 99.4 (*) 80 - 97 fL   MCH, POC 31.6 (*) 27 - 31.2 pg   MCHC 31.7 (*) 31.8 - 35.4 g/dL   RDW, POC 04.5     Platelet Count, POC 254  142 - 424 K/uL   MPV 7.9  0 - 99.8 fL  POCT INFLUENZA A/B      Result Value Range   Influenza A, POC Negative     Influenza B, POC Negative           Assessment & Plan:  We'll treat with Omnicef 300 mg 2 a day. I also refilled her Singulair. Her EBV titers were drawn and pending

## 2013-04-11 LAB — EPSTEIN-BARR VIRUS VCA ANTIBODY PANEL
EBV EA IgG: <5 U/mL
EBV NA IgG: >600 U/mL — ABNORMAL HIGH
EBV VCA IgG: >750 U/mL — ABNORMAL HIGH
EBV VCA IgM: 20.8 U/mL

## 2013-04-12 ENCOUNTER — Telehealth: Payer: Self-pay

## 2013-04-12 NOTE — Telephone Encounter (Signed)
SHEILA WOULD LIKE SOMEONE TO EXPLAIN TO HER WHETHER OR NOT HER DAUGHTER HAVE MONO, WAS TOLD ONE TEST SAID SHE HAD IT AND THE OTHER SAID SHE DIDN'T IS ALSO TAKING MEDICINE FOR IT. PLEASE CALL [phone]

## 2013-04-12 NOTE — Telephone Encounter (Signed)
Notes Recorded by Collene Gobble, MD on 04/11/2013 at 7:27 AM Patient shows signs of a past infection but not recently. She is advised

## 2013-04-12 NOTE — Telephone Encounter (Signed)
Mother called back advised throat still sore. She should use aleve for this, and let us know if this does not help

## 2013-08-06 ENCOUNTER — Ambulatory Visit (INDEPENDENT_AMBULATORY_CARE_PROVIDER_SITE_OTHER): Payer: BC Managed Care – PPO | Admitting: Family Medicine

## 2013-08-06 VITALS — BP 130/74 | HR 80 | Temp 98.2°F | Resp 16 | Ht 65.5 in | Wt 121.2 lb

## 2013-08-06 DIAGNOSIS — K6289 Other specified diseases of anus and rectum: Secondary | ICD-10-CM

## 2013-08-06 DIAGNOSIS — K5900 Constipation, unspecified: Secondary | ICD-10-CM

## 2013-08-06 DIAGNOSIS — K644 Residual hemorrhoidal skin tags: Secondary | ICD-10-CM

## 2013-08-06 LAB — IFOBT (OCCULT BLOOD): IFOBT: NEGATIVE

## 2013-08-06 MED ORDER — HYDROCORTISONE ACETATE 25 MG RE SUPP: 25.0000 mg | Freq: Two times a day (BID) | RECTAL | Status: DC

## 2013-08-06 NOTE — Patient Instructions (Signed)
Hemorrhoids Hemorrhoids are swollen veins around the rectum or anus. There are two types of hemorrhoids:   Internal hemorrhoids. These occur in the veins just inside the rectum. They may poke through to the outside and become irritated and painful.  External hemorrhoids. These occur in the veins outside the anus and can be felt as a painful swelling or hard lump near the anus. CAUSES  Pregnancy.   Obesity.   Constipation or diarrhea.   Straining to have a bowel movement.   Sitting for long periods on the toilet.  Heavy lifting or other activity that caused you to strain.  Anal intercourse. SYMPTOMS   Pain.   Anal itching or irritation.   Rectal bleeding.   Fecal leakage.   Anal swelling.   One or more lumps around the anus.  DIAGNOSIS  Your caregiver may be able to diagnose hemorrhoids by visual examination. Other examinations or tests that may be performed include:   Examination of the rectal area with a gloved hand (digital rectal exam).   Examination of anal canal using a small tube (scope).   A blood test if you have lost a significant amount of blood.  A test to look inside the colon (sigmoidoscopy or colonoscopy). TREATMENT Most hemorrhoids can be treated at home. However, if symptoms do not seem to be getting better or if you have a lot of rectal bleeding, your caregiver may perform a procedure to help make the hemorrhoids get smaller or remove them completely. Possible treatments include:   Placing a rubber band at the base of the hemorrhoid to cut off the circulation (rubber band ligation).   Injecting a chemical to shrink the hemorrhoid (sclerotherapy).   Using a tool to burn the hemorrhoid (infrared light therapy).   Surgically removing the hemorrhoid (hemorrhoidectomy).   Stapling the hemorrhoid to block blood flow to the tissue (hemorrhoid stapling).  HOME CARE INSTRUCTIONS   Eat foods with fiber, such as whole grains, beans,  nuts, fruits, and vegetables. Ask your doctor about taking products with added fiber in them (fibersupplements).  Increase fluid intake. Drink enough water and fluids to keep your urine clear or pale yellow.   Exercise regularly.   Go to the bathroom when you have the urge to have a bowel movement. Do not wait.   Avoid straining to have bowel movements.   Keep the anal area dry and clean. Use wet toilet paper or moist towelettes after a bowel movement.   Medicated creams and suppositories may be used or applied as directed.   Only take over-the-counter or prescription medicines as directed by your caregiver.   Take warm sitz baths for 15 20 minutes, 3 4 times a day to ease pain and discomfort.   Place ice packs on the hemorrhoids if they are tender and swollen. Using ice packs between sitz baths may be helpful.   Put ice in a plastic bag.   Place a towel between your skin and the bag.   Leave the ice on for 15 20 minutes, 3 4 times a day.   Do not use a donut-shaped pillow or sit on the toilet for long periods. This increases blood pooling and pain.  SEEK MEDICAL CARE IF:  You have increasing pain and swelling that is not controlled by treatment or medicine.  You have uncontrolled bleeding.  You have difficulty or you are unable to have a bowel movement.  You have pain or inflammation outside the area of the hemorrhoids. MAKE SURE YOU:    Understand these instructions.  Will watch your condition.  Will get help right away if you are not doing well or get worse. Document Released: 06/19/2000 Document Revised: 06/08/2012 Document Reviewed: 04/26/2012 [employer] Patient Information 2014 [employer].  

## 2013-08-06 NOTE — Progress Notes (Signed)
Subjective:    Patient ID: Grace Campbell, female    DOB: 06-May-1991, 23 y.o.   MRN: [id number]  HPI This 23 y.o. female presents for evaluation of hemorrhoidal pain.  Onset of symptoms on 07/28/13.  Suffered with constipation the week prior to onset of symptoms.  Developed soreness around rectum.  Applied cold compress.  Looked with mirror.  Swelling has improved but wants further evaluation.  Looked up hemorrhoids on Google. Started new OCP/Loestrin in the past month.  Having daily bowel movements; B.m.s were less easy.  No blood on toilet paper.  No pain with b.m. Applied Preparation H. Cooling.   No anal intercourse.  Mild anal itching.  LMP this week; still slight spotting.  Review of Systems  Constitutional: Negative for fever, chills, diaphoresis, appetite change and fatigue.  Gastrointestinal: Positive for constipation. Negative for nausea, vomiting, abdominal pain, diarrhea, blood in stool and anal bleeding.  Genitourinary: Negative for dysuria, vaginal bleeding and vaginal discharge.   Past Medical History  Diagnosis Date  . Medical history non-contributory   . Allergy   . Anemia   . Depression   . Anxiety    Allergies  Allergen Reactions  . Prozac [Fluoxetine Hcl] Other (See Comments)    Suicidal thoughts   Current Outpatient Prescriptions on File Prior to Visit  Medication Sig Dispense Refill  . albuterol (VENTOLIN HFA) 108 (90 BASE) MCG/ACT inhaler Inhale 2 puffs into the lungs every 6 (six) hours as needed for wheezing.  18 g  2  . azelastine (ASTELIN) 137 MCG/SPRAY nasal spray Place 2 sprays into the nose 2 (two) times daily. Use in each nostril as directed  30 mL  12  . fluticasone (FLONASE) 50 MCG/ACT nasal spray Place 2 sprays into the nose daily.  16 g  5  . IRON PO Take 1 tablet by mouth 2 (two) times daily.      . montelukast (SINGULAIR) 10 MG tablet Take 1 tablet (10 mg total) by mouth at bedtime.  30 tablet  11  . ondansetron (ZOFRAN-ODT) 8 MG disintegrating  tablet Take 1 tablet (8 mg total) by mouth every 8 (eight) hours as needed for nausea.  30 tablet  0   No current facility-administered medications on file prior to visit.   History   Social History  . Marital Status: Single    Spouse Name: N/A    Number of Children: N/A  . Years of Education: N/A   Occupational History  . Not on file.   Social History Main Topics  . Smoking status: Never Smoker   . Smokeless tobacco: Not on file  . Alcohol Use: Yes  . Drug Use: No  . Sexual Activity: Yes    Partners: Male   Other Topics Concern  . Not on file   Social History Narrative   Marital status: single; not dating.      Children: none      Education: [employer]      Lives: in [place]; stays on campus.      Tobacco; none      Alcohol: socially      Drugs: none      Exercise: none   History reviewed. No pertinent family history.      Objective:   Physical Exam  Nursing note and vitals reviewed. Constitutional: She appears well-developed and well-nourished. No distress.  HENT:  Head: Normocephalic and atraumatic.  Cardiovascular: Normal rate and regular rhythm.   Murmur heard. Pulmonary/Chest: Effort normal.  Abdominal: Soft. Bowel sounds are normal. She exhibits no distension. There is no tenderness. There is no rebound and no guarding.  Genitourinary: Rectal exam shows external hemorrhoid. Rectal exam shows no fissure, no mass, no tenderness and anal tone normal.  Skin: She is not diaphoretic.      Assessment & Plan:  Unspecified constipation  Hemorrhoids, external - Plan: hydrocortisone (ANUSOL-HC) 25 MG suppository, IFOBT POC (occult bld, rslt in office)  Pain, rectum  1. External hemorrhoids:  New.  Discussed etiology and treatment; continue preparation H, Tucks; rx for Anusol HC provided to use PRN.  Avoid constipation and diarrhea. No thrombosed hemorrhoids. 2.  Constipation: New.  Likely etiology to external hemorrhoid formation.  Meds ordered  this encounter  Medications  . hydrocortisone (ANUSOL-HC) 25 MG suppository    Sig: Place 1 suppository (25 mg total) rectally 2 (two) times daily.    Dispense:  12 suppository    Refill:  0   Reginia Forts, M.D.  [facility] [address] [place], [place]  [address] [phone] phone [phone] fax

## 2014-01-01 ENCOUNTER — Other Ambulatory Visit: Payer: Self-pay | Admitting: Obstetrics and Gynecology

## 2014-01-01 ENCOUNTER — Other Ambulatory Visit (HOSPITAL_COMMUNITY)
Admission: RE | Admit: 2014-01-01 | Discharge: 2014-01-01 | Disposition: A | Discharge status: Home / Self Care | Payer: BC Managed Care – PPO | Source: Ambulatory Visit | Attending: Obstetrics and Gynecology | Admitting: Obstetrics and Gynecology

## 2014-01-01 DIAGNOSIS — Z01419 Encounter for gynecological examination (general) (routine) without abnormal findings: Secondary | ICD-10-CM | POA: Insufficient documentation

## 2014-01-03 LAB — CYTOLOGY - PAP

## 2014-04-13 ENCOUNTER — Emergency Department (HOSPITAL_COMMUNITY)
Admission: EM | Admit: 2014-04-13 | Discharge: 2014-04-13 | Disposition: A | Discharge status: Home / Self Care | Payer: BC Managed Care – PPO | Source: Home / Self Care | Attending: Emergency Medicine | Admitting: Emergency Medicine

## 2014-04-13 ENCOUNTER — Encounter (HOSPITAL_COMMUNITY): Payer: Self-pay | Admitting: Emergency Medicine

## 2014-04-13 DIAGNOSIS — J029 Acute pharyngitis, unspecified: Secondary | ICD-10-CM

## 2014-04-13 DIAGNOSIS — J9801 Acute bronchospasm: Secondary | ICD-10-CM

## 2014-04-13 LAB — POCT RAPID STREP A: Streptococcus, Group A Screen (Direct): NEGATIVE

## 2014-04-13 MED ORDER — ALBUTEROL SULFATE HFA 108 (90 BASE) MCG/ACT IN AERS: 2.0000 | INHALATION_SPRAY | Freq: Four times a day (QID) | RESPIRATORY_TRACT | Status: DC | PRN

## 2014-04-13 MED ORDER — NORETHIN-ETH ESTRAD-FE BIPHAS 1 MG-10 MCG / 10 MCG PO TABS: 1.0000 | ORAL_TABLET | Freq: Every day | ORAL | Status: DC

## 2014-04-13 NOTE — ED Notes (Signed)
C/o  Sore throat since Wednesday.  Gradually getting worse.  Denies fever, n/v/d.  No otc meds taken.

## 2014-04-13 NOTE — Discharge Instructions (Signed)
You have a viral illness.   This will take 7-10 days to resolve.  For the sore throat you can use... 1. Chloroseptic spray 2. Salt water gargles 3. Tea with honey  For the body aches... 1. Naprosyn twice a day 2. Acetaminophen 1000mg  every 8 hours as needed  If your symptoms change or worsen, please come back.

## 2014-04-13 NOTE — ED Provider Notes (Signed)
[name]: [id number]     Arrival date & time 04/13/14  1927 History   First MD Initiated Contact with Patient 04/13/14 1936     Chief Complaint  Patient presents with  . Sore Throat   (Consider location/radiation/quality/duration/timing/severity/associated sxs/prior Treatment) HPI She is a 23 year old woman here for evaluation of sore throat. She states the sore throat started 2 days ago, and has gradually been worsening. It is associated with body aches, chills, subjective fever, cough, mild shortness of breath. She reports decreased appetite and some mild nausea. She denies any vomiting or diarrhea. She is tolerating fluids well. She reports multiple sick contacts with similar symptoms. She does have a history of asthma and allergies. She currently denies any wheezing.  Past Medical History  Diagnosis Date  . Medical history non-contributory   . Allergy   . Anemia   . Depression   . Anxiety    Past Surgical History  Procedure Laterality Date  . No past surgeries     History reviewed. No pertinent family history. History  Substance Use Topics  . Smoking status: Never Smoker   . Smokeless tobacco: Not on file  . Alcohol Use: Yes   OB History   Grav Para Term Preterm Abortions TAB SAB Ect Mult Living   1              Review of Systems  Constitutional: Positive for chills and appetite change.  HENT: Positive for rhinorrhea and sore throat. Negative for trouble swallowing.   Respiratory: Positive for cough and shortness of breath. Negative for wheezing.   Cardiovascular: Negative.   Gastrointestinal: Positive for nausea. Negative for vomiting, abdominal pain and diarrhea.  Musculoskeletal: Positive for myalgias.    Allergies  Prozac  Home Medications   Prior to Admission medications   Medication Sig Start Date End Date Taking? Authorizing Provider  albuterol (VENTOLIN HFA) 108 (90 BASE) MCG/ACT inhaler Inhale 2 puffs into the lungs every 6 (six) hours as needed for  wheezing. 04/13/14   Melony Overly, MD  azelastine (ASTELIN) 137 MCG/SPRAY nasal spray Place 2 sprays into the nose 2 (two) times daily. Use in each nostril as directed 03/02/13   Wardell Honour, MD  fluticasone [facility]) 50 MCG/ACT nasal spray Place 2 sprays into the nose daily. 01/13/13   Posey Boyer, MD  hydrocortisone (ANUSOL-HC) 25 MG suppository Place 1 suppository (25 mg total) rectally 2 (two) times daily. 08/06/13   Wardell Honour, MD  IRON PO Take 1 tablet by mouth 2 (two) times daily.    Historical Provider, MD  montelukast (SINGULAIR) 10 MG tablet Take 1 tablet (10 mg total) by mouth at bedtime. 04/10/13   Darlyne Russian, MD  Norethindrone-Ethinyl Estradiol-Fe Biphas (LO LOESTRIN FE) 1 MG-10 MCG / 10 MCG tablet Take 1 tablet by mouth daily. 04/13/14   Melony Overly, MD  ondansetron (ZOFRAN-ODT) 8 MG disintegrating tablet Take 1 tablet (8 mg total) by mouth every 8 (eight) hours as needed for nausea. 03/02/13   Wardell Honour, MD   BP 119/89  Pulse 80  Temp(Src) 98.8 F (37.1 C) (Oral)  Resp 12  SpO2 100%  LMP 04/08/2014 Physical Exam  Constitutional: She is oriented to person, place, and time. She appears well-developed and well-nourished. No distress.  HENT:  Head: Normocephalic and atraumatic.  Right Ear: External ear normal.  Left Ear: External ear normal.  Nose: Rhinorrhea present. No mucosal edema.  Mouth/Throat: Mucous membranes are normal. Posterior oropharyngeal erythema present.  No oropharyngeal exudate.  Neck: Neck supple.  Cardiovascular: Normal rate, regular rhythm and normal heart sounds.   No murmur heard. Pulmonary/Chest: Effort normal and breath sounds normal. No respiratory distress. She has no wheezes. She has no rales.  Lymphadenopathy:    She has no cervical adenopathy.  Neurological: She is alert and oriented to person, place, and time.  Skin: Skin is warm and dry. She is not diaphoretic.    ED Course  Procedures (including critical care time) Labs  Review Labs Reviewed  POCT RAPID STREP A ([facility] CARE ONLY)    Imaging Review No results found.   MDM   1. Viral pharyngitis   2. Cough due to bronchospasm    Rapid strep is negative. History and exam consistent with a viral process. Discussed symptomatic treatment as in the after visit summary. Reviewed reasons to return including persistent fever, vomiting, severe sore throat preventing her from drinking fluids. Provided refill of her albuterol inhaler to use as needed. Also provided a one month refill of her birth control prescription.    Melony Overly, MD 04/13/14 2013

## 2014-04-15 LAB — CULTURE, GROUP A STREP

## 2014-05-07 ENCOUNTER — Encounter (HOSPITAL_COMMUNITY): Payer: Self-pay | Admitting: Emergency Medicine

## 2014-09-05 ENCOUNTER — Ambulatory Visit (INDEPENDENT_AMBULATORY_CARE_PROVIDER_SITE_OTHER): Payer: BC Managed Care – PPO | Admitting: Physician Assistant

## 2014-09-05 VITALS — BP 108/68 | HR 75 | Temp 98.1°F | Resp 16 | Ht 66.0 in | Wt 123.0 lb

## 2014-09-05 DIAGNOSIS — K5900 Constipation, unspecified: Secondary | ICD-10-CM

## 2014-09-05 DIAGNOSIS — R110 Nausea: Secondary | ICD-10-CM

## 2014-09-05 DIAGNOSIS — N9489 Other specified conditions associated with female genital organs and menstrual cycle: Secondary | ICD-10-CM

## 2014-09-05 DIAGNOSIS — Z309 Encounter for contraceptive management, unspecified: Secondary | ICD-10-CM

## 2014-09-05 DIAGNOSIS — A499 Bacterial infection, unspecified: Secondary | ICD-10-CM

## 2014-09-05 DIAGNOSIS — N898 Other specified noninflammatory disorders of vagina: Secondary | ICD-10-CM

## 2014-09-05 DIAGNOSIS — N760 Acute vaginitis: Secondary | ICD-10-CM | POA: Diagnosis not present

## 2014-09-05 DIAGNOSIS — J4520 Mild intermittent asthma, uncomplicated: Secondary | ICD-10-CM

## 2014-09-05 DIAGNOSIS — B9689 Other specified bacterial agents as the cause of diseases classified elsewhere: Secondary | ICD-10-CM

## 2014-09-05 DIAGNOSIS — Z789 Other specified health status: Secondary | ICD-10-CM

## 2014-09-05 DIAGNOSIS — J9801 Acute bronchospasm: Secondary | ICD-10-CM

## 2014-09-05 LAB — POCT WET PREP WITH KOH
KOH PREP POC: NEGATIVE
Trichomonas, UA: NEGATIVE
Yeast Wet Prep HPF POC: NEGATIVE

## 2014-09-05 MED ORDER — ONDANSETRON 8 MG PO TBDP: 8.0000 mg | ORAL_TABLET | Freq: Three times a day (TID) | ORAL | Status: DC | PRN

## 2014-09-05 MED ORDER — METRONIDAZOLE 500 MG PO TABS: 500.0000 mg | ORAL_TABLET | Freq: Two times a day (BID) | ORAL | Status: DC

## 2014-09-05 MED ORDER — NORETHIN-ETH ESTRAD-FE BIPHAS 1 MG-10 MCG / 10 MCG PO TABS: 1.0000 | ORAL_TABLET | Freq: Every day | ORAL | Status: DC

## 2014-09-05 MED ORDER — ALBUTEROL SULFATE HFA 108 (90 BASE) MCG/ACT IN AERS: 2.0000 | INHALATION_SPRAY | Freq: Four times a day (QID) | RESPIRATORY_TRACT | Status: DC | PRN

## 2014-09-05 NOTE — Progress Notes (Signed)
Subjective:    Patient ID: Grace Campbell, female    DOB: Aug 06, 1990, 24 y.o.   MRN: [id number]  Chief Complaint  Patient presents with  . Medication Refill    BC, albuterol inhaler, zofran  . Constipation    x 2 weeks   Patient Active Problem List   Diagnosis Date Noted  . Depression with anxiety 08/31/2011  . Insomnia 08/31/2011  . Allergic rhinitis due to pollen 08/31/2011  . H. pylori infection 08/31/2011   Prior to Admission medications   Medication Sig Start Date End Date Taking? Authorizing Provider  albuterol (VENTOLIN HFA) 108 (90 BASE) MCG/ACT inhaler Inhale 2 puffs into the lungs every 6 (six) hours as needed for wheezing. 04/13/14  Yes Melony Overly, MD  IRON PO Take 1 tablet by mouth 2 (two) times daily.   Yes Historical Provider, MD  montelukast (SINGULAIR) 10 MG tablet Take 1 tablet (10 mg total) by mouth at bedtime. 04/10/13  Yes Darlyne Russian, MD  Norethindrone-Ethinyl Estradiol-Fe Biphas (LO LOESTRIN FE) 1 MG-10 MCG / 10 MCG tablet Take 1 tablet by mouth daily. 04/13/14  Yes Melony Overly, MD  ondansetron (ZOFRAN-ODT) 8 MG disintegrating tablet Take 1 tablet (8 mg total) by mouth every 8 (eight) hours as needed for nausea. 03/02/13  Yes Wardell Honour, MD   Medications, allergies, past medical history, surgical history, family history, social history and problem list reviewed and updated.  HPI  47 yof with pmh mild intermittent asthma presents for med refills, for constipation, and vaginal odor.   Med refills - Needs birth control refill. Has been on for several yrs. Needs albuterol refill. Not out yet but being proactive. States has hx mild asthma. Uses albuterol approx 3x/week. Wakes at night approx 1x/month needing rescue inhaler. Feels that she wheezes sometimes. Needs zofran refill. Uses rarely but has had intermittent nausea since pregnancy 2 yrs ago. Taking a trip for couple months upcoming so would like the medication.   Constipation - Mild for several yrs. Has  never taken anything for it. Recently increased her water intake. Does not watch her fiber intake. No blood in stool. Has approx 1-2 bms per week. Normal.   Vaginal odor - Three day h/o fishy odor. Unsure if shes had a discharge as just finished menses. No sexual activity past 4-5 months. No itching. No dyspareunia.   Review of Systems No cp, sob, fever, chills.     Objective:   Physical Exam  Constitutional: She is oriented to person, place, and time. She appears well-developed and well-nourished.  Non-toxic appearance. She does not have a sickly appearance. She does not appear ill. No distress.  BP 108/68 mmHg  Pulse 75  Temp(Src) 98.1 F (36.7 C)  Resp 16  Ht 5\' 6"  (1.676 m)  Wt 123 lb (55.792 kg)  BMI 19.86 kg/m2  SpO2 100%  LMP 09/05/2014   Eyes: Conjunctivae and EOM are normal.  Cardiovascular: Normal rate, regular rhythm and normal heart sounds.   Pulmonary/Chest: Effort normal and breath sounds normal. She has no decreased breath sounds. She has no wheezes. She has no rhonchi. She has no rales.  Abdominal: Soft. Normal appearance and bowel sounds are normal. There is no tenderness. There is no CVA tenderness.  Genitourinary: Vaginal discharge found.  Thin white dc coating vaginal wall. No odor.   Lymphadenopathy:       Right: No inguinal adenopathy present.       Left: No inguinal adenopathy present.  Neurological: She is alert and oriented to person, place, and time.  Skin: Skin is warm and dry. No rash noted.  Psychiatric: She has a normal mood and affect. Her speech is normal.   Spirometry:  FEV1: 84% FVC: 87% FEV1/FVC: 98%  Results for orders placed or performed in visit on 09/05/14  POCT Wet Prep with KOH  Result Value Ref Range   Trichomonas, UA Negative    Clue Cells Wet Prep HPF POC 1-3    Epithelial Wet Prep HPF POC 0-2    Yeast Wet Prep HPF POC neg    Bacteria Wet Prep HPF POC 2+    RBC Wet Prep HPF POC 0-1    WBC Wet Prep HPF POC 2-4    KOH Prep POC  Negative       Assessment & Plan:   25 yof with pmh mild intermittent asthma presents for med refills, for constipation, and vaginal odor.   Reactive airway disease, mild intermittent, uncomplicated - Plan: PFT PULM FXN SPIROMETRY (94010) --spirometry normal, likely still in mild intermittent range --refilled albuterol, no need for inhaled cs at this time  Vaginal odor - Plan: POCT Wet Prep with KOH, GC/Chlamydia Probe Amp --clue cells on wet prep --flagyl  Nausea without vomiting --no current nausea --refilled zofran #30 for upcoming trip   Uses birth control --refilled one year  Constipation, unspecified constipation type --increase water intake, increase fiber intake, start fiber supp agent like metamucil --rtc 2-3 months if this is not helping can possibly add osmotic agent  Julieta Gutting, PA-C Physician Assistant-Certified [facility]  09/05/2014 5:58 PM

## 2014-09-05 NOTE — Patient Instructions (Addendum)
I've refilled your birth control for one year.  Your breathing test was normal. You do not need to start a 2nd inhaler at this time. I've refilled your albuterol.  I refilled the zofran.  For the constipation, increasing water, increasing exercise, and increasing fiber intake will help. Metamucil may help. If you're still constipated in 1-2 months, please come back for further evaluation.  I'll be in contact with you with the swab results. You do have BV. Please take the flagyl twice daily for one week.   Constipation Constipation is when a person has fewer than three bowel movements a week, has difficulty having a bowel movement, or has stools that are dry, hard, or larger than normal. As people grow older, constipation is more common. If you try to fix constipation with medicines that make you have a bowel movement (laxatives), the problem may get worse. Long-term laxative use may cause the muscles of the colon to become weak. A low-fiber diet, not taking in enough fluids, and taking certain medicines may make constipation worse.  CAUSES   Certain medicines, such as antidepressants, pain medicine, iron supplements, antacids, and water pills.   Certain diseases, such as diabetes, irritable bowel syndrome (IBS), thyroid disease, or depression.   Not drinking enough water.   Not eating enough fiber-rich foods.   Stress or travel.   Lack of physical activity or exercise.   Ignoring the urge to have a bowel movement.   Using laxatives too much.  SIGNS AND SYMPTOMS   Having fewer than three bowel movements a week.   Straining to have a bowel movement.   Having stools that are hard, dry, or larger than normal.   Feeling full or bloated.   Pain in the lower abdomen.   Not feeling relief after having a bowel movement.  DIAGNOSIS  Your health care provider will take a medical history and perform a physical exam. Further testing may be done for severe constipation. Some  tests may include:  A barium enema X-ray to examine your rectum, colon, and, sometimes, your small intestine.   A sigmoidoscopy to examine your lower colon.   A colonoscopy to examine your entire colon. TREATMENT  Treatment will depend on the severity of your constipation and what is causing it. Some dietary treatments include drinking more fluids and eating more fiber-rich foods. Lifestyle treatments may include regular exercise. If these diet and lifestyle recommendations do not help, your health care provider may recommend taking over-the-counter laxative medicines to help you have bowel movements. Prescription medicines may be prescribed if over-the-counter medicines do not work.  HOME CARE INSTRUCTIONS   Eat foods that have a lot of fiber, such as fruits, vegetables, whole grains, and beans.  Limit foods high in fat and processed sugars, such as french fries, hamburgers, cookies, candies, and soda.   A fiber supplement may be added to your diet if you cannot get enough fiber from foods.   Drink enough fluids to keep your urine clear or pale yellow.   Exercise regularly or as directed by your health care provider.   Go to the restroom when you have the urge to go. Do not hold it.   Only take over-the-counter or prescription medicines as directed by your health care provider. Do not take other medicines for constipation without talking to your health care provider first.  Walker IF:   You have bright red blood in your stool.   Your constipation lasts for more than 4  days or gets worse.   You have abdominal or rectal pain.   You have thin, pencil-like stools.   You have unexplained weight loss. MAKE SURE YOU:   Understand these instructions.  Will watch your condition.  Will get help right away if you are not doing well or get worse. Document Released: 03/20/2004 Document Revised: 06/27/2013 Document Reviewed: 04/03/2013 [facility] Patient  Information 2015 [place], [place]. This information is not intended to replace advice given to you by your health care provider. Make sure you discuss any questions you have with your health care provider.

## 2014-09-07 LAB — GC/CHLAMYDIA PROBE AMP
CT Probe RNA: NEGATIVE
GC Probe RNA: NEGATIVE

## 2014-09-10 ENCOUNTER — Encounter (HOSPITAL_BASED_OUTPATIENT_CLINIC_OR_DEPARTMENT_OTHER): Payer: Self-pay | Admitting: Emergency Medicine

## 2014-09-10 DIAGNOSIS — J111 Influenza due to unidentified influenza virus with other respiratory manifestations: Secondary | ICD-10-CM | POA: Diagnosis not present

## 2014-09-10 DIAGNOSIS — R05 Cough: Secondary | ICD-10-CM | POA: Diagnosis present

## 2014-09-10 DIAGNOSIS — Z862 Personal history of diseases of the blood and blood-forming organs and certain disorders involving the immune mechanism: Secondary | ICD-10-CM | POA: Insufficient documentation

## 2014-09-10 DIAGNOSIS — Z793 Long term (current) use of hormonal contraceptives: Secondary | ICD-10-CM | POA: Insufficient documentation

## 2014-09-10 DIAGNOSIS — Z79899 Other long term (current) drug therapy: Secondary | ICD-10-CM | POA: Insufficient documentation

## 2014-09-10 DIAGNOSIS — Z8639 Personal history of other endocrine, nutritional and metabolic disease: Secondary | ICD-10-CM | POA: Insufficient documentation

## 2014-09-10 LAB — RAPID STREP SCREEN (MED CTR MEBANE ONLY): Streptococcus, Group A Screen (Direct): NEGATIVE

## 2014-09-10 NOTE — ED Notes (Signed)
Pt states that she has been vomiting and having runny nose and achy since this morning.

## 2014-09-11 ENCOUNTER — Emergency Department (HOSPITAL_BASED_OUTPATIENT_CLINIC_OR_DEPARTMENT_OTHER)
Admission: EM | Admit: 2014-09-11 | Discharge: 2014-09-11 | Disposition: A | Discharge status: Home / Self Care | Payer: BC Managed Care – PPO | Attending: Emergency Medicine | Admitting: Emergency Medicine

## 2014-09-11 DIAGNOSIS — J111 Influenza due to unidentified influenza virus with other respiratory manifestations: Secondary | ICD-10-CM

## 2014-09-11 DIAGNOSIS — R110 Nausea: Secondary | ICD-10-CM

## 2014-09-11 MED ORDER — ACETAMINOPHEN 325 MG PO TABS
650.0000 mg | ORAL_TABLET | Freq: Once | ORAL | Status: AC
  Administered 2014-09-11: 650 mg via ORAL
  Filled 2014-09-11: qty 2

## 2014-09-11 MED ORDER — HYDROCOD POLST-CHLORPHEN POLST 10-8 MG/5ML PO LQCR: 5.0000 mL | Freq: Two times a day (BID) | ORAL | Status: DC | PRN

## 2014-09-11 MED ORDER — ONDANSETRON 8 MG PO TBDP: 8.0000 mg | ORAL_TABLET | Freq: Three times a day (TID) | ORAL | Status: DC | PRN

## 2014-09-11 MED ORDER — ONDANSETRON 8 MG PO TBDP
8.0000 mg | ORAL_TABLET | Freq: Once | ORAL | Status: AC
  Administered 2014-09-11: 8 mg via ORAL

## 2014-09-11 MED ORDER — ONDANSETRON 8 MG PO TBDP
ORAL_TABLET | ORAL | Status: AC
  Filled 2014-09-11: qty 1

## 2014-09-11 NOTE — ED Provider Notes (Signed)
[name]: [id number]     Arrival date & time 09/10/14  2320 History   First MD Initiated Contact with Patient 09/11/14 0206     Chief Complaint  Patient presents with  . Flu-Like Symptoms      (Consider location/radiation/quality/duration/timing/severity/associated sxs/prior Treatment) HPI  This is a 24 year old female with a one-day history of flulike symptoms. Specifically she has had general malaise, body aches, fever, chills, rhinorrhea, cough and sore throat. Yesterday morning she had vomiting and diarrhea. The diarrhea has passed but she continues to vomit especially with paroxysms of cough. She has an inhaler but has not been using it because she has not felt the need. She was noted to have a temperature of 100.6 in the ED and was given Tylenol.  Past Medical History  Diagnosis Date  . Medical history non-contributory   . Allergy   . Anemia   . Depression   . Anxiety    Past Surgical History  Procedure Laterality Date  . No past surgeries     History reviewed. No pertinent family history. History  Substance Use Topics  . Smoking status: Never Smoker   . Smokeless tobacco: Not on file  . Alcohol Use: 0.6 oz/week    1 Glasses of wine per week   OB History    Gravida Para Term Preterm AB TAB SAB Ectopic Multiple Living   1              Review of Systems  All other systems reviewed and are negative.   Allergies  Prozac  Home Medications   Prior to Admission medications   Medication Sig Start Date End Date Taking? Authorizing Provider  albuterol (VENTOLIN HFA) 108 (90 BASE) MCG/ACT inhaler Inhale 2 puffs into the lungs every 6 (six) hours as needed for wheezing. 09/05/14   Merlinda Frederick McVeigh, PA  IRON PO Take 1 tablet by mouth 2 (two) times daily.    Historical Provider, MD  metroNIDAZOLE (FLAGYL) 500 MG tablet Take 1 tablet (500 mg total) by mouth 2 (two) times daily with a meal. DO NOT CONSUME ALCOHOL WHILE TAKING THIS MEDICATION. 09/05/14   Merlinda Frederick McVeigh, PA  montelukast  (SINGULAIR) 10 MG tablet Take 1 tablet (10 mg total) by mouth at bedtime. 04/10/13   Darlyne Russian, MD  Norethindrone-Ethinyl Estradiol-Fe Biphas (LO LOESTRIN FE) 1 MG-10 MCG / 10 MCG tablet Take 1 tablet by mouth daily. 09/05/14   Merlinda Frederick McVeigh, PA  ondansetron (ZOFRAN-ODT) 8 MG disintegrating tablet Take 1 tablet (8 mg total) by mouth every 8 (eight) hours as needed for nausea. 09/05/14   Todd M McVeigh, PA   BP 123/62 mmHg  Pulse 110  Temp(Src) 100.6 F (38.1 C) (Oral)  Resp 18  Ht 5\' 6"  (1.676 m)  Wt 120 lb (54.432 kg)  BMI 19.38 kg/m2  SpO2 98%  LMP 09/05/2014   Physical Exam  General: Well-developed, well-nourished female in no acute distress; appearance consistent with age of record HENT: normocephalic; atraumatic; pharynx normal Eyes: pupils equal, round and reactive to light; extraocular muscles intact Neck: supple; anterior cervical lymphadenopathy Heart: regular rate and rhythm Lungs: clear to auscultation bilaterally Abdomen: soft; nondistended; nontender; no masses or hepatosplenomegaly; bowel sounds present Extremities: No deformity; full range of motion; pulses normal Neurologic: Awake, alert and oriented; motor function intact in all extremities and symmetric; no facial droop Skin: Warm and dry Psychiatric: Flat affect    ED Course  Procedures (including critical care time)   MDM  Nursing notes and vitals signs, including pulse oximetry, reviewed.  Summary of this visit's results, reviewed by myself:  Labs:  Results for orders placed or performed during the hospital encounter of 09/11/14 (from the past 24 hour(s))  Rapid strep screen     Status: None   Collection Time: 09/10/14 11:29 PM  Result Value Ref Range   Streptococcus, Group A Screen (Direct) NEGATIVE NEGATIVE   Symptoms consistent with influenza which has been noted to be present in this state recently.  Shanon Rosser, MD 09/11/14 [phone]

## 2014-09-13 LAB — CULTURE, GROUP A STREP: Strep A Culture: NEGATIVE

## 2014-09-17 ENCOUNTER — Ambulatory Visit (INDEPENDENT_AMBULATORY_CARE_PROVIDER_SITE_OTHER): Payer: BC Managed Care – PPO | Admitting: Family Medicine

## 2014-09-17 ENCOUNTER — Ambulatory Visit (HOSPITAL_COMMUNITY): Admission: RE | Admit: 2014-09-17 | Payer: BC Managed Care – PPO | Source: Ambulatory Visit

## 2014-09-17 ENCOUNTER — Telehealth: Payer: Self-pay | Admitting: Family Medicine

## 2014-09-17 VITALS — BP 120/86 | HR 61 | Temp 97.7°F | Resp 16 | Ht 66.0 in | Wt 114.0 lb

## 2014-09-17 DIAGNOSIS — G44031 Episodic paroxysmal hemicrania, intractable: Secondary | ICD-10-CM

## 2014-09-17 MED ORDER — BUTALBITAL-APAP-CAFFEINE 50-325-40 MG PO TABS: 1.0000 | ORAL_TABLET | Freq: Four times a day (QID) | ORAL | Status: DC | PRN

## 2014-09-17 NOTE — Patient Instructions (Signed)

## 2014-09-17 NOTE — Progress Notes (Signed)
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 This is a worse when she's ever had  Past infrequent migraine hx  Headache made worse by movement.  Denies photophobia or vomiting.  No visual change noted.  Objective:  Lying quietly in light diminished room  HEENT: normal Neck:  Able to move chin down when holding her head in hands, no adenopathy or thyromegaly Chest:  Clear Heart:  Reg, no murmur Moving 4 extremities. Neuro intact  Assessment:  Worst headache of life following the "flu"  Plan:       ICD-9-CM ICD-10-CM   1. 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

## 2014-09-17 NOTE — Telephone Encounter (Signed)
[facility] radiology called, pt showed up and was told to go to registration, she went to registration and was told the window was closed she would need to go to the ED to register. She was very upset with this experience and refused to register or have imaging study done at [facility].   apologized and offered to attempt to have imaging done at [facility]; the patient refused, she said it would not be done today. She may not have transportation to [facility]. I informed Dr. Federico Flake.

## 2014-10-02 ENCOUNTER — Other Ambulatory Visit: Payer: Self-pay

## 2014-10-02 MED ORDER — MONTELUKAST SODIUM 10 MG PO TABS: 10.0000 mg | ORAL_TABLET | Freq: Every day | ORAL | Status: DC

## 2014-11-18 ENCOUNTER — Ambulatory Visit (INDEPENDENT_AMBULATORY_CARE_PROVIDER_SITE_OTHER): Payer: BC Managed Care – PPO | Admitting: Emergency Medicine

## 2014-11-18 VITALS — BP 118/78 | HR 54 | Temp 98.3°F | Resp 20 | Ht 66.5 in | Wt 122.2 lb

## 2014-11-18 DIAGNOSIS — H66009 Acute suppurative otitis media without spontaneous rupture of ear drum, unspecified ear: Secondary | ICD-10-CM

## 2014-11-18 DIAGNOSIS — J209 Acute bronchitis, unspecified: Secondary | ICD-10-CM

## 2014-11-18 MED ORDER — AMOXICILLIN-POT CLAVULANATE 875-125 MG PO TABS: 1.0000 | ORAL_TABLET | Freq: Two times a day (BID) | ORAL | Status: DC

## 2014-11-18 MED ORDER — PSEUDOEPHEDRINE-GUAIFENESIN ER 60-600 MG PO TB12: 1.0000 | ORAL_TABLET | Freq: Two times a day (BID) | ORAL | Status: DC

## 2014-11-18 MED ORDER — HYDROCOD POLST-CPM POLST ER 10-8 MG/5ML PO SUER: 5.0000 mL | Freq: Two times a day (BID) | ORAL | Status: DC

## 2014-11-18 NOTE — Progress Notes (Signed)
[facility] [address] [place] [address] [phone]  Date:  11/18/2014   Name:  Grace Campbell   DOB:  April 30, 1991   MRN:  [id number]  PCP:  Leandrew Koyanagi, MD    Chief Complaint: Cough; Sore Throat; and Ear Pain   History of Present Illness:  Grace Campbell is a 24 y.o. very pleasant female patient who presents with the following:  Ill with nasal congestion and mucoid drainage. Has post nasal drainage. Cough productive mucoid sputum, some wheezing treated with albuterol.   Little exertional shortness of breath Patient denies nausea or vomiting Patient denies fever or chills. Today awoke with right ear pain. No improvement with over the counter medications or other home remedies.  Denies other complaint or health concern today.   Patient Active Problem List   Diagnosis Date Noted  . Depression with anxiety 08/31/2011  . Insomnia 08/31/2011  . Allergic rhinitis due to pollen 08/31/2011  . H. pylori infection 08/31/2011    Past Medical History  Diagnosis Date  . Medical history non-contributory   . Allergy   . Anemia   . Depression   . Anxiety     Past Surgical History  Procedure Laterality Date  . No past surgeries      History  Substance Use Topics  . Smoking status: Never Smoker   . Smokeless tobacco: Not on file  . Alcohol Use: 0.6 oz/week    1 Glasses of wine per week    History reviewed. No pertinent family history.  Allergies  Allergen Reactions  . Prozac [Fluoxetine Hcl] Other (See Comments)    Suicidal thoughts    Medication list has been reviewed and updated.  Current Outpatient Prescriptions on File Prior to Visit  Medication Sig Dispense Refill  . albuterol (VENTOLIN HFA) 108 (90 BASE) MCG/ACT inhaler Inhale 2 puffs into the lungs every 6 (six) hours as needed for wheezing. 18 g 2  . IRON PO Take 1 tablet by mouth 2 (two) times daily.    . montelukast (SINGULAIR) 10 MG tablet Take 1 tablet (10 mg total)  by mouth at bedtime. 30 tablet 5  . Norethindrone-Ethinyl Estradiol-Fe Biphas (LO LOESTRIN FE) 1 MG-10 MCG / 10 MCG tablet Take 1 tablet by mouth daily. 3 Package 3  . butalbital-acetaminophen-caffeine (FIORICET) 50-325-40 MG per tablet Take 1-2 tablets by mouth every 6 (six) hours as needed for headache. (Patient not taking: Reported on 11/18/2014) 20 tablet 0  . chlorpheniramine-HYDROcodone (TUSSIONEX PENNKINETIC ER) 10-8 MG/5ML LQCR Take 5 mLs by mouth every 12 (twelve) hours as needed. (Patient not taking: Reported on 11/18/2014) 70 mL 0  . metroNIDAZOLE (FLAGYL) 500 MG tablet Take 1 tablet (500 mg total) by mouth 2 (two) times daily with a meal. DO NOT CONSUME ALCOHOL WHILE TAKING THIS MEDICATION. (Patient not taking: Reported on 11/18/2014) 14 tablet 0  . ondansetron (ZOFRAN-ODT) 8 MG disintegrating tablet Take 1 tablet (8 mg total) by mouth every 8 (eight) hours as needed for nausea or vomiting. (Patient not taking: Reported on 11/18/2014) 10 tablet 0   No current facility-administered medications on file prior to visit.    Review of Systems:  Review of Systems  Constitutional: Negative for fever, chills and fatigue.  HENT: Negative for congestion, ear pain, hearing loss, postnasal drip, rhinorrhea and sinus pressure.   Eyes: Negative for discharge and redness.  Respiratory: Negative for cough, shortness of breath and wheezing.   Cardiovascular: Negative for chest pain and  leg swelling.  Gastrointestinal: Negative for nausea, vomiting, abdominal pain, constipation and blood in stool.  Genitourinary: Negative for dysuria, urgency and frequency.  Musculoskeletal: Negative for neck stiffness.  Skin: Negative for rash.  Neurological: Negative for seizures, weakness and headaches.   Physical Examination: Filed Vitals:   11/18/14 1224  BP: 118/78  Pulse: 54  Temp: 98.3 F (36.8 C)  Resp: 20   Filed Vitals:   11/18/14 1224  Height: 5' 6.5" (1.689 m)  Weight: 122 lb 4 oz (55.452 kg)    Body mass index is 19.44 kg/(m^2). Ideal Body Weight: Weight in (lb) to have BMI = 25: 156.9  GEN: WDWN, NAD, Non-toxic, A & O x 3 HEENT: Atraumatic, Normocephalic. Neck supple. No masses, No LAD. Ears and Nose: No external deformity.  Right otitis media CV: RRR, No M/G/R. No JVD. No thrill. No extra heart sounds. PULM: CTA B, no wheezes, crackles, rhonchi. No retractions. No resp. distress. No accessory muscle use. ABD: S, NT, ND, +BS. No rebound. No HSM. EXTR: No c/c/e NEURO Normal gait.  PSYCH: Normally interactive. Conversant. Not depressed or anxious appearing.  Calm demeanor.    Assessment and Plan: 1. Acute suppurative otitis media without spontaneous rupture of ear drum, recurrence not specified, unspecified laterality   - amoxicillin-clavulanate (AUGMENTIN) 875-125 MG per tablet; Take 1 tablet by mouth 2 (two) times daily.  Dispense: 20 tablet; Refill: 0 - pseudoephedrine-guaifenesin (MUCINEX D) 60-600 MG per tablet; Take 1 tablet by mouth every 12 (twelve) hours.  Dispense: 18 tablet; Refill: 0  2. Acute bronchitis, unspecified organism   - chlorpheniramine-HYDROcodone (TUSSIONEX PENNKINETIC ER) 10-8 MG/5ML SUER; Take 5 mLs by mouth 2 (two) times daily.  Dispense: 60 mL; Refill: 0    Signed Ellison Carwin, MD

## 2015-06-03 ENCOUNTER — Encounter: Payer: Self-pay | Admitting: Internal Medicine

## 2015-06-12 ENCOUNTER — Emergency Department (HOSPITAL_COMMUNITY)
Admission: EM | Admit: 2015-06-12 | Discharge: 2015-06-12 | Disposition: A | Discharge status: Home / Self Care | Payer: BC Managed Care – PPO | Attending: Emergency Medicine | Admitting: Emergency Medicine

## 2015-06-12 ENCOUNTER — Encounter (HOSPITAL_COMMUNITY): Payer: Self-pay

## 2015-06-12 ENCOUNTER — Ambulatory Visit (INDEPENDENT_AMBULATORY_CARE_PROVIDER_SITE_OTHER): Payer: BC Managed Care – PPO | Admitting: Family Medicine

## 2015-06-12 ENCOUNTER — Ambulatory Visit (HOSPITAL_COMMUNITY)
Admission: RE | Admit: 2015-06-12 | Discharge: 2015-06-12 | Disposition: A | Discharge status: Home / Self Care | Payer: BC Managed Care – PPO | Source: Ambulatory Visit | Attending: Family Medicine | Admitting: Family Medicine

## 2015-06-12 ENCOUNTER — Telehealth: Payer: Self-pay | Admitting: Emergency Medicine

## 2015-06-12 ENCOUNTER — Telehealth: Payer: Self-pay

## 2015-06-12 ENCOUNTER — Ambulatory Visit (INDEPENDENT_AMBULATORY_CARE_PROVIDER_SITE_OTHER): Payer: BC Managed Care – PPO

## 2015-06-12 ENCOUNTER — Encounter (HOSPITAL_COMMUNITY): Payer: Self-pay | Admitting: *Deleted

## 2015-06-12 VITALS — BP 120/82 | HR 79 | Temp 98.0°F | Resp 16 | Ht 66.5 in | Wt 120.0 lb

## 2015-06-12 DIAGNOSIS — R198 Other specified symptoms and signs involving the digestive system and abdomen: Secondary | ICD-10-CM

## 2015-06-12 DIAGNOSIS — Z793 Long term (current) use of hormonal contraceptives: Secondary | ICD-10-CM | POA: Diagnosis not present

## 2015-06-12 DIAGNOSIS — Z8659 Personal history of other mental and behavioral disorders: Secondary | ICD-10-CM | POA: Insufficient documentation

## 2015-06-12 DIAGNOSIS — Z862 Personal history of diseases of the blood and blood-forming organs and certain disorders involving the immune mechanism: Secondary | ICD-10-CM | POA: Diagnosis not present

## 2015-06-12 DIAGNOSIS — Z3202 Encounter for pregnancy test, result negative: Secondary | ICD-10-CM | POA: Insufficient documentation

## 2015-06-12 DIAGNOSIS — R1031 Right lower quadrant pain: Secondary | ICD-10-CM

## 2015-06-12 DIAGNOSIS — R10829 Rebound abdominal tenderness, unspecified site: Secondary | ICD-10-CM | POA: Diagnosis not present

## 2015-06-12 DIAGNOSIS — Z79899 Other long term (current) drug therapy: Secondary | ICD-10-CM | POA: Diagnosis not present

## 2015-06-12 DIAGNOSIS — R112 Nausea with vomiting, unspecified: Secondary | ICD-10-CM

## 2015-06-12 DIAGNOSIS — R1032 Left lower quadrant pain: Secondary | ICD-10-CM

## 2015-06-12 DIAGNOSIS — K529 Noninfective gastroenteritis and colitis, unspecified: Secondary | ICD-10-CM | POA: Diagnosis not present

## 2015-06-12 LAB — POCT URINALYSIS DIP (MANUAL ENTRY)
Blood, UA: NEGATIVE
Glucose, UA: NEGATIVE
Leukocytes, UA: NEGATIVE
Nitrite, UA: NEGATIVE
Spec Grav, UA: >=1.03
Urobilinogen, UA: 0.2
pH, UA: 5.5

## 2015-06-12 LAB — COMPREHENSIVE METABOLIC PANEL
ALT: 27 U/L (ref 14–54)
AST: 18 U/L (ref 15–41)
Albumin: 4.5 g/dL (ref 3.5–5.0)
Alkaline Phosphatase: 35 U/L — ABNORMAL LOW (ref 38–126)
Anion gap: 9 (ref 5–15)
BILIRUBIN TOTAL: 1.7 mg/dL — AB (ref 0.3–1.2)
BUN: 8 mg/dL (ref 6–20)
CHLORIDE: 106 mmol/L (ref 101–111)
CO2: 23 mmol/L (ref 22–32)
CREATININE: 0.81 mg/dL (ref 0.44–1.00)
Calcium: 9.5 mg/dL (ref 8.9–10.3)
Glucose, Bld: 78 mg/dL (ref 65–99)
Potassium: 3.9 mmol/L (ref 3.5–5.1)
Sodium: 138 mmol/L (ref 135–145)
TOTAL PROTEIN: 7.6 g/dL (ref 6.5–8.1)

## 2015-06-12 LAB — CBC
HCT: 39.3 % (ref 36.0–46.0)
Hemoglobin: 13.5 g/dL (ref 12.0–15.0)
MCH: 32.7 pg (ref 26.0–34.0)
MCHC: 34.4 g/dL (ref 30.0–36.0)
MCV: 95.2 fL (ref 78.0–100.0)
PLATELETS: 259 10*3/uL (ref 150–400)
RBC: 4.13 MIL/uL (ref 3.87–5.11)
RDW: 12.6 % (ref 11.5–15.5)
WBC: 4.9 10*3/uL (ref 4.0–10.5)

## 2015-06-12 LAB — POCT CBC
Granulocyte percent: 71.3 %G (ref 37–80)
HCT, POC: 39.4 % (ref 37.7–47.9)
Hemoglobin: 13.6 g/dL (ref 12.2–16.2)
Lymph, poc: 1.2 (ref 0.6–3.4)
MCH, POC: 32.6 pg — AB (ref 27–31.2)
MCHC: 34.6 g/dL (ref 31.8–35.4)
MCV: 94.4 fL (ref 80–97)
MID (cbc): 0.3 (ref 0–0.9)
MPV: 6.7 fL (ref 0–99.8)
POC Granulocyte: 3.8 (ref 2–6.9)
POC LYMPH PERCENT: 22.6 %L (ref 10–50)
POC MID %: 6.1 %M (ref 0–12)
Platelet Count, POC: 251 10*3/uL (ref 142–424)
RBC: 4.17 M/uL (ref 4.04–5.48)
RDW, POC: 13.5 %
WBC: 5.3 10*3/uL (ref 4.6–10.2)

## 2015-06-12 LAB — POC MICROSCOPIC URINALYSIS (UMFC)

## 2015-06-12 LAB — LIPASE, BLOOD: Lipase: 23 U/L (ref 11–51)

## 2015-06-12 LAB — POC URINE PREG, ED: PREG TEST UR: NEGATIVE

## 2015-06-12 LAB — URINALYSIS, ROUTINE W REFLEX MICROSCOPIC
GLUCOSE, UA: NEGATIVE mg/dL
Hgb urine dipstick: NEGATIVE
LEUKOCYTES UA: NEGATIVE
NITRITE: NEGATIVE
PROTEIN: NEGATIVE mg/dL
Specific Gravity, Urine: >1.046 — ABNORMAL HIGH (ref 1.005–1.030)
pH: 5.5 (ref 5.0–8.0)

## 2015-06-12 LAB — POCT URINE PREGNANCY: Preg Test, Ur: NEGATIVE

## 2015-06-12 MED ORDER — ONDANSETRON 4 MG PO TBDP: ORAL_TABLET | ORAL | Status: DC

## 2015-06-12 MED ORDER — IOHEXOL 300 MG/ML  SOLN
100.0000 mL | Freq: Once | INTRAMUSCULAR | Status: AC | PRN
  Administered 2015-06-12: 100 mL via INTRAVENOUS

## 2015-06-12 MED ORDER — CIPROFLOXACIN IN D5W 400 MG/200ML IV SOLN
400.0000 mg | Freq: Once | INTRAVENOUS | Status: AC
  Administered 2015-06-12: 400 mg via INTRAVENOUS
  Filled 2015-06-12: qty 200

## 2015-06-12 MED ORDER — MORPHINE SULFATE (PF) 4 MG/ML IV SOLN
4.0000 mg | Freq: Once | INTRAVENOUS | Status: AC
  Administered 2015-06-12: 4 mg via INTRAVENOUS
  Filled 2015-06-12: qty 1

## 2015-06-12 MED ORDER — SODIUM CHLORIDE 0.9 % IV BOLUS (SEPSIS)
1000.0000 mL | Freq: Once | INTRAVENOUS | Status: AC
  Administered 2015-06-12: 1000 mL via INTRAVENOUS

## 2015-06-12 MED ORDER — METRONIDAZOLE IN NACL 5-0.79 MG/ML-% IV SOLN
500.0000 mg | Freq: Once | INTRAVENOUS | Status: AC
  Administered 2015-06-12: 500 mg via INTRAVENOUS
  Filled 2015-06-12: qty 100

## 2015-06-12 MED ORDER — CIPROFLOXACIN HCL 500 MG PO TABS: 500.0000 mg | ORAL_TABLET | Freq: Two times a day (BID) | ORAL | Status: DC

## 2015-06-12 MED ORDER — METRONIDAZOLE 500 MG PO TABS: 500.0000 mg | ORAL_TABLET | Freq: Two times a day (BID) | ORAL | Status: DC

## 2015-06-12 MED ORDER — ONDANSETRON HCL 4 MG/2ML IJ SOLN
4.0000 mg | Freq: Once | INTRAMUSCULAR | Status: AC
  Administered 2015-06-12: 4 mg via INTRAVENOUS
  Filled 2015-06-12: qty 2

## 2015-06-12 MED ORDER — OXYCODONE HCL 5 MG PO TABS: 5.0000 mg | ORAL_TABLET | ORAL | Status: DC | PRN

## 2015-06-12 NOTE — ED Notes (Signed)
Pt states she had CT scan performed today showing colitis. Pt was told to come to ED for treatment. Pt complains of nausea, vomiting, frequent diarrhea, central lower abdominal pain since 3PM yesterday.

## 2015-06-12 NOTE — Telephone Encounter (Signed)
I called patient. She is going to the ER at [facility]

## 2015-06-12 NOTE — Discharge Instructions (Signed)
Take your medicines as prescribed. Return for sudden worsening pain. Inability to eat or drink at home. Colitis Colitis is inflammation of the colon. Colitis may last a short time (acute) or it may last a long time (chronic). CAUSES This condition may be caused by:  Viruses.  Bacteria.  Reactions to medicine.  Certain autoimmune diseases, such as Crohn disease or ulcerative colitis. SYMPTOMS Symptoms of this condition include:  Diarrhea.  Passing bloody or tarry stool.  Pain.  Fever.  Vomiting.  Tiredness (fatigue).  Weight loss.  Bloating.  Sudden increase in abdominal pain.  Having fewer bowel movements than usual. DIAGNOSIS This condition is diagnosed with a stool test or a blood test. You may also have other tests, including X-rays, a CT scan, or a colonoscopy. TREATMENT Treatment may include:  Resting the bowel. This involves not eating or drinking for a period of time.  Fluids that are given through an IV tube.  Medicine for pain and diarrhea.  Antibiotic medicines.  Cortisone medicines.  Surgery. HOME CARE INSTRUCTIONS Eating and Drinking  Follow instructions from your health care provider about eating or drinking restrictions.  Drink enough fluid to keep your urine clear or pale yellow.  Work with a dietitian to determine which foods cause your condition to flare up.  Avoid foods that cause flare-ups.  Eat a well-balanced diet. Medicines  Take over-the-counter and prescription medicines only as told by your health care provider.  If you were prescribed an antibiotic medicine, take it as told by your health care provider. Do not stop taking the antibiotic even if you start to feel better. General Instructions  Keep all follow-up visits as told by your health care provider. This is important. SEEK MEDICAL CARE IF:  Your symptoms do not go away.  You develop new symptoms. SEEK IMMEDIATE MEDICAL CARE IF:  You have a fever that does not  go away with treatment.  You develop chills.  You have extreme weakness, fainting, or dehydration.  You have repeated vomiting.  You develop severe pain in your abdomen.  You pass bloody or tarry stool.   This information is not intended to replace advice given to you by your health care provider. Make sure you discuss any questions you have with your health care provider.   Document Released: 07/30/2004 Document Revised: 03/13/2015 Document Reviewed: 10/15/2014 Elsevier Interactive Patient Education [employer].

## 2015-06-12 NOTE — Telephone Encounter (Signed)
Thank you so much for taking care of this Grace Campbell

## 2015-06-12 NOTE — ED Provider Notes (Signed)
[name]: [id number]     Arrival date & time 06/12/15  1934 History   First MD Initiated Contact with Patient 06/12/15 2054     Chief Complaint  Patient presents with  . Abdominal Pain  . Diarrhea     (Consider location/radiation/quality/duration/timing/severity/associated sxs/prior Treatment) Patient is a 24 y.o. female presenting with abdominal pain and diarrhea. The history is provided by the patient.  Abdominal Pain Pain location:  LLQ, RLQ and suprapubic Pain quality: aching, sharp and shooting   Pain radiates to:  Does not radiate Pain severity:  Moderate Onset quality:  Sudden Duration:  2 days Timing:  Constant Progression:  Worsening Chronicity:  New Relieved by:  Nothing Worsened by:  Nothing tried Ineffective treatments:  None tried Associated symptoms: diarrhea, nausea and vomiting   Associated symptoms: no chest pain, no chills, no dysuria, no fever and no shortness of breath   Diarrhea Associated symptoms: abdominal pain and vomiting   Associated symptoms: no arthralgias, no chills, no fever, no headaches and no myalgias    24 yo F with a chief complaint of lower abdominal pain. This been going on for a couple days. Patient has had vomiting and diarrhea with it. Went to see her PCP and had an outpatient CT scan that was concerning for colitis. Patient was then sent here for further evaluation. Patient since that episode is been feeling mildly better. Has been able to tolerate by mouth at home.  Past Medical History  Diagnosis Date  . Medical history non-contributory   . Allergy   . Anemia   . Depression   . Anxiety    Past Surgical History  Procedure Laterality Date  . No past surgeries     No family history on file. Social History  Substance Use Topics  . Smoking status: Never Smoker   . Smokeless tobacco: None  . Alcohol Use: 0.6 oz/week    1 Glasses of wine per week   OB History    Gravida Para Term Preterm AB TAB SAB Ectopic Multiple Living   1               Review of Systems  Constitutional: Negative for fever and chills.  HENT: Negative for congestion and rhinorrhea.   Eyes: Negative for redness and visual disturbance.  Respiratory: Negative for shortness of breath and wheezing.   Cardiovascular: Negative for chest pain and palpitations.  Gastrointestinal: Positive for nausea, vomiting, abdominal pain and diarrhea.  Genitourinary: Negative for dysuria and urgency.  Musculoskeletal: Negative for myalgias and arthralgias.  Skin: Negative for pallor and wound.  Neurological: Negative for dizziness and headaches.      Allergies  Prozac and Ibuprofen  Home Medications   Prior to Admission medications   Medication Sig Start Date End Date Taking? Authorizing Provider  albuterol (VENTOLIN HFA) 108 (90 BASE) MCG/ACT inhaler Inhale 2 puffs into the lungs every 6 (six) hours as needed for wheezing. 09/05/14  Yes Todd McVeigh, PA  clindamycin (CLEOCIN T) 1 % external solution Apply 1 application topically every morning. 06/03/15  Yes Historical Provider, MD  montelukast (SINGULAIR) 10 MG tablet Take 1 tablet (10 mg total) by mouth at bedtime. 10/02/14  Yes Todd McVeigh, PA  Norethindrone-Ethinyl Estradiol-Fe Biphas (LO LOESTRIN FE) 1 MG-10 MCG / 10 MCG tablet Take 1 tablet by mouth daily. 09/05/14  Yes Todd McVeigh, PA  Tretinoin, Facial Wrinkles, (TRETINOIN, EMOLLIENT,) 0.05 % CREA Apply 1 application topically at bedtime. 06/03/15  Yes Historical Provider, MD  ciprofloxacin (CIPRO)  500 MG tablet Take 1 tablet (500 mg total) by mouth 2 (two) times daily. One po bid x 7 days 06/12/15   Deno Etienne, DO  metroNIDAZOLE (FLAGYL) 500 MG tablet Take 1 tablet (500 mg total) by mouth 2 (two) times daily. One po bid x 7 days 06/12/15   Deno Etienne, DO  ondansetron (ZOFRAN ODT) 4 MG disintegrating tablet 4mg  ODT q4 hours prn nausea/vomit 06/12/15   Deno Etienne, DO  oxyCODONE (ROXICODONE) 5 MG immediate release tablet Take 1 tablet (5 mg total) by mouth every 4  (four) hours as needed for severe pain. 06/12/15   Deno Etienne, DO   BP 119/83 mmHg  Pulse 73  Temp(Src) 98.4 F (36.9 C) (Oral)  Resp 18  Ht 5\' 6"  (1.676 m)  Wt 120 lb (54.432 kg)  BMI 19.38 kg/m2  SpO2 100%  LMP 05/31/2015 Physical Exam  Constitutional: She is oriented to person, place, and time. She appears well-developed and well-nourished. No distress.  HENT:  Head: Normocephalic and atraumatic.  Eyes: EOM are normal. Pupils are equal, round, and reactive to light.  Neck: Normal range of motion. Neck supple.  Cardiovascular: Normal rate and regular rhythm.  Exam reveals no gallop and no friction rub.   No murmur heard. Pulmonary/Chest: Effort normal. She has no wheezes. She has no rales.  Abdominal: Soft. She exhibits no distension. There is tenderness (mild lower). There is no rebound and no guarding.  Musculoskeletal: She exhibits no edema or tenderness.  Neurological: She is alert and oriented to person, place, and time.  Skin: Skin is warm and dry. She is not diaphoretic.  Psychiatric: She has a normal mood and affect. Her behavior is normal.  Nursing note and vitals reviewed.   ED Course  Procedures (including critical care time) Labs Review Labs Reviewed  COMPREHENSIVE METABOLIC PANEL - Abnormal; Notable for the following:    Alkaline Phosphatase 35 (*)    Total Bilirubin 1.7 (*)    All other components within normal limits  URINALYSIS, ROUTINE W REFLEX MICROSCOPIC (NOT AT [facility]) - Abnormal; Notable for the following:    Specific Gravity, Urine >1.046 (*)    Bilirubin Urine SMALL (*)    Ketones, ur >80 (*)    All other components within normal limits  LIPASE, BLOOD  CBC  POC URINE PREG, ED    Imaging Review Dg Abd 1 View  06/12/2015  CLINICAL DATA:  Abdominal pain. EXAM: ABDOMEN - 1 VIEW COMPARISON:  06/15/2009 . FINDINGS: Soft tissue structures are unremarkable. Several air-filled loops of slightly prominent small bowel are noted. There is mild small bowel  wall fold thickening. Active small-bowel pathology including enteritis could present in this fashion. Continued follow-up exam suggested to demonstrate clearing and to exclude developing bowel obstruction. Colonic gas pattern is unremarkable. No free air. No acute bony abnormality. Pelvic calcifications consistent phleboliths . IMPRESSION: Several air-filled loops of slightly prominent small bowel are noted. There is mild small bowel wall fold thickening. Active small bowel pathology including enteritis could present in this fashion. Continued follow-up exam suggested to demonstrate clearing and to exclude developing bowel obstruction. Electronically Signed   By: Marcello Moores  Register   On: 06/12/2015 10:14   Ct Abdomen Pelvis W Contrast  06/12/2015  CLINICAL DATA:  Bilateral lower abdominal pain, nausea and vomiting for 1 day. EXAM: CT ABDOMEN AND PELVIS WITH CONTRAST TECHNIQUE: Multidetector CT imaging of the abdomen and pelvis was performed using the standard protocol following bolus administration of intravenous contrast. CONTRAST:  154mL OMNIPAQUE IOHEXOL 300 MG/ML  SOLN COMPARISON:  None. FINDINGS: Lower chest: The lung bases are clear. No pleural effusion or pulmonary nodule. The heart is normal in size. No pericardial effusion. The distal esophagus is grossly normal. Hepatobiliary: No focal hepatic lesions or intrahepatic biliary dilatation. The gallbladder is normal. No common bile duct dilatation. Pancreas: No mass, inflammation or ductal dilatation. Spleen: Normal size.  No focal lesions. Adrenals/Urinary Tract: The adrenal glands and kidneys are normal. Stomach/Bowel: The stomach, duodenum, and small bowel are unremarkable. No inflammatory changes, mass lesions or obstructive findings. Severe inflammatory or infectious process involving the upper aspect of the ascending colon, the transverse colon and the upper aspect of the descending colon. The sigmoid colon rectum appear normal. The terminal ileum is  normal. The appendix is normal. Vascular/Lymphatic: No mesenteric or retroperitoneal mass or adenopathy. Scattered lymph nodes are noted. Reproductive: There is a large uterine fibroid measuring 8 x 5.5 cm. The ovaries are normal. Other: No pelvic mass or adenopathy. Small amount of free pelvic fluid. No abdominal wall hernia or subcutaneous lesions. Musculoskeletal: No significant bony findings. IMPRESSION: 1. Severe inflammatory or infectious colitis. This is mainly involving the transverse colon but also the adjacent aspects of the ascending and descending colon. 2. Scattered mesenteric and retroperitoneal lymph nodes but no mass or adenopathy. 3. Large fibroid involving the posterior myometrium. Electronically Signed   By: Marijo Sanes M.D.   On: 06/12/2015 16:31   I have personally reviewed and evaluated these images and lab results as part of my medical decision-making.   EKG Interpretation None      MDM   Final diagnoses:  Colitis    24 yo F with a chief complaint of lower abdominal pain. Benign abdominal exam. CT scan with colitis. Offered outpatient therapy will give the patient prescription for Roxicodone as well as Zofran Cipro and Flagyl. Discussed that the patient needs to follow-up with her PCP and likely GI for colonoscopy post resolution to evaluate for the possibility of Crohn's or ulcerative colitis.  11:29 PM:  I have discussed the diagnosis/risks/treatment options with the patient and family and believe the pt to be eligible for discharge home to follow-up with PCP. We also discussed returning to the ED immediately if new or worsening sx occur. We discussed the sx which are most concerning (e.g., sudden worsening pain, fever, inability to tolerate by mouth) that necessitate immediate return. Medications administered to the patient during their visit and any new prescriptions provided to the patient are listed below.  Medications given during this visit Medications  sodium  chloride 0.9 % bolus 1,000 mL (1,000 mLs Intravenous New Bag/Given 06/12/15 2119)  ciprofloxacin (CIPRO) IVPB 400 mg (400 mg Intravenous New Bag/Given 06/12/15 2228)  metroNIDAZOLE (FLAGYL) IVPB 500 mg (0 mg Intravenous Stopped 06/12/15 2228)  morphine 4 MG/ML injection 4 mg (4 mg Intravenous Given 06/12/15 2119)  ondansetron (ZOFRAN) injection 4 mg (4 mg Intravenous Given 06/12/15 2119)    New Prescriptions   CIPROFLOXACIN (CIPRO) 500 MG TABLET    Take 1 tablet (500 mg total) by mouth 2 (two) times daily. One po bid x 7 days   METRONIDAZOLE (FLAGYL) 500 MG TABLET    Take 1 tablet (500 mg total) by mouth 2 (two) times daily. One po bid x 7 days   ONDANSETRON (ZOFRAN ODT) 4 MG DISINTEGRATING TABLET    4mg  ODT q4 hours prn nausea/vomit   OXYCODONE (ROXICODONE) 5 MG IMMEDIATE RELEASE TABLET    Take 1 tablet (  5 mg total) by mouth every 4 (four) hours as needed for severe pain.    The patient appears reasonably screen and/or stabilized for discharge and I doubt any other medical condition or other [facility] requiring further screening, evaluation, or treatment in the ED at this time prior to discharge.      Deno Etienne, DO 06/12/15 2329

## 2015-06-12 NOTE — Telephone Encounter (Signed)
Pt was sent for a ct scan and mom is calling stating that she is in a lot of pain and is there something that can be called in   Best number to call mom is [phone]

## 2015-06-12 NOTE — Telephone Encounter (Signed)
Spoke with mother, then patient, then patient's father, then ED Charge nurse.  Patient has severe colitis. Advised to go to the ED at [facility] for additional evaluation, treatment and pain management.  IMPRESSION: 1. Severe inflammatory or infectious colitis. This is mainly involving the transverse colon but also the adjacent aspects of the ascending and descending colon. 2. Scattered mesenteric and retroperitoneal lymph nodes but no mass or adenopathy. 3. Large fibroid involving the posterior myometrium.

## 2015-06-12 NOTE — Progress Notes (Signed)
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  Pain is crampy.    Has had pain before but not as severe.  There was blood noticed with diarrhea on tissue.  No  Known fever.  Has had constipation in past week.  She notes some polyuria  Objective:  NAD BP 120/82 mmHg  Pulse 79  Temp(Src) 98 F (36.7 C) (Oral)  Resp 16  Ht 5' 6.5" (1.689 m)  Wt 120 lb (54.432 kg)  BMI 19.08 kg/m2  SpO2 99%  LMP 05/31/2015 HEENT: Unremarkable with normal mucous membranes, hearing, EOM Neck: Supple no adenopathy or thyromegaly Chest: Clear Heart: Regular without murmur or gallop Abdomen: Scaphoid, soft, diffusely tender but more tender just below the umbilicus in the midline, some guarding and mild rebound Skin: No rash Extremities: No edema  Results for orders placed or performed in visit on 06/12/15  POCT CBC  Result Value Ref Range   WBC 5.3 4.6 - 10.2 K/uL   Lymph, poc 1.2 0.6 - 3.4   POC LYMPH PERCENT 22.6 10 - 50 %L   MID (cbc) 0.3 0 - 0.9   POC MID % 6.1 0 - 12 %M   POC Granulocyte 3.8 2 - 6.9   Granulocyte percent 71.3 37 - 80 %G   RBC 4.17 4.04 - 5.48 M/uL   Hemoglobin 13.6 12.2 - 16.2 g/dL   HCT, POC 39.4 37.7 - 47.9 %   MCV 94.4 80 - 97 fL   MCH, POC 32.6 (A) 27 - 31.2 pg   MCHC 34.6 31.8 - 35.4 g/dL   RDW, POC 13.5 %   Platelet Count, POC 251 142 - 424 K/uL   MPV 6.7 0 - 99.8 fL  POCT urinalysis dipstick  Result Value Ref Range   Color, UA yellow yellow   Clarity, UA clear clear   Glucose, UA negative negative   Bilirubin, UA small (A) negative   Ketones, POC UA >= (160) (A) negative   Spec Grav, UA >=1.030    Blood, UA negative negative   pH, UA 5.5    Protein Ur, POC trace (A) negative   Urobilinogen, UA 0.2    Nitrite, UA Negative Negative   Leukocytes, UA Negative Negative  POCT Microscopic Urinalysis ([facility])  Result Value Ref Range   WBC,UR,HPF,POC None None WBC/hpf   RBC,UR,HPF,POC None None RBC/hpf   Bacteria Few (A) None, Too numerous to count   Mucus Present (A) Absent   Epithelial Cells, UR Per Microscopy Few (A) None, Too numerous to count cells/hpf   [facility] reading (PRIMARY) by  Dr. Joseph Art:  KUB-->. Some dilated loops of bowel but no definite explanation for the acute abdominal pain.  Assessment: This may represent an acute appendicitis is developing. Because of this, we need to get his CT scans morning.  Plan: Proceed with CT scan of abdomen and pelvis with rule out appendectomy protocol. If the CT is benign, we can proceed with some Bentyl, Zofran, clear liquids. Otherwise, she will need to be seen in the emergency room.  Signed, Carola Frost.D.

## 2015-06-12 NOTE — Patient Instructions (Signed)
You will need to arrive at [facility] at 3:45pm for outpatient CT abd/pelvis. Register at first floor radiology. If you want to eat something you will need to eat before 12:00 pm. Nothing to eat or drink after 12:00 pm  Oral Contrast: 1st bottle: 2:00pm 2nd bottle: 3pm

## 2015-06-12 NOTE — ED Notes (Signed)
Pt, being sent by Urgent Care, c/o bilateral lower abdominal pain and n/v/d starting this morning.  CT abdomen resulted severe colitis.

## 2015-06-13 ENCOUNTER — Telehealth: Payer: Self-pay | Admitting: Family Medicine

## 2015-06-13 ENCOUNTER — Telehealth: Payer: Self-pay | Admitting: *Deleted

## 2015-06-13 ENCOUNTER — Ambulatory Visit (INDEPENDENT_AMBULATORY_CARE_PROVIDER_SITE_OTHER): Payer: BC Managed Care – PPO | Admitting: Gastroenterology

## 2015-06-13 ENCOUNTER — Telehealth: Payer: Self-pay | Admitting: Internal Medicine

## 2015-06-13 ENCOUNTER — Other Ambulatory Visit: Payer: BC Managed Care – PPO

## 2015-06-13 ENCOUNTER — Encounter: Payer: Self-pay | Admitting: Gastroenterology

## 2015-06-13 VITALS — BP 110/62 | HR 68 | Ht 66.0 in | Wt 121.8 lb

## 2015-06-13 DIAGNOSIS — K529 Noninfective gastroenteritis and colitis, unspecified: Secondary | ICD-10-CM

## 2015-06-13 DIAGNOSIS — A09 Infectious gastroenteritis and colitis, unspecified: Secondary | ICD-10-CM | POA: Diagnosis not present

## 2015-06-13 MED ORDER — NA SULFATE-K SULFATE-MG SULF 17.5-3.13-1.6 GM/177ML PO SOLN: 1.0000 | Freq: Once | ORAL | Status: DC

## 2015-06-13 NOTE — Telephone Encounter (Signed)
Referral placed.

## 2015-06-13 NOTE — Telephone Encounter (Signed)
Pt mom called stating pt was told by Dr. Tyrone Nine to call Vel[facility] Heckler or [facility] today for Chron's evaluation.  NCM read Dr Miachel Roux note that states pt is to follow up with PCP for GI referral.  NCM explained to pt mom and she apologized for misunderstanding.  No other NCM needs stated.

## 2015-06-13 NOTE — Progress Notes (Signed)
HPI: This is a   very pleasant 24 year old woman   who was referred to me by Leandrew Koyanagi, MD  to evaluate  abnormal colon, abdominal pain .    Chief complaint is abnormal colon, abdominal pain  Felt tight in abd 2 months ago.  2 days ago tightness in abd, then gnawing pain.    1 day ago diarrhea yesterday.  Takes smooth move, tea for constipation.  Usually will go daily or every other day.  Does not have loose stools.  Generally pretty constipated.  Overall stable weight.   She tells me she has had stomach flu at least once a year. And that she was told she had food poisoning 2 or 3 times last year with vomiting illness.  She has been on doxycycline for about 3 weeks now for skin infection. No eye issues, rashes.  She tends to be constipated a lot.   CT scan IV and PO contrast 06/12/2015 . Severe inflammatory or infectious colitis. This is mainly involving the transverse colon but also the adjacent aspects of the ascending and descending colon.2. Scattered mesenteric and retroperitoneal lymph nodes but no mass or adenopathy. 3. Large fibroid involving the posterior myometrium.  Labs 06/11/2016: cbc normal, cmet normal except Tbili 1.7   Review of systems: Pertinent positive and negative review of systems were noted in the above HPI section. Complete review of systems was performed and was otherwise normal.   Past Medical History  Diagnosis Date  . Medical history non-contributory   . Allergy   . Anemia   . Depression   . Anxiety     Past Surgical History  Procedure Laterality Date  . No past surgeries      Current Outpatient Prescriptions  Medication Sig Dispense Refill  . albuterol (VENTOLIN HFA) 108 (90 BASE) MCG/ACT inhaler Inhale 2 puffs into the lungs every 6 (six) hours as needed for wheezing. 18 g 2  . ciprofloxacin (CIPRO) 500 MG tablet Take 1 tablet (500 mg total) by mouth 2 (two) times daily. One po bid x 7 days 14 tablet 0  . clindamycin (CLEOCIN  T) 1 % external solution Apply 1 application topically every morning.  11  . metroNIDAZOLE (FLAGYL) 500 MG tablet Take 1 tablet (500 mg total) by mouth 2 (two) times daily. One po bid x 7 days 14 tablet 0  . montelukast (SINGULAIR) 10 MG tablet Take 1 tablet (10 mg total) by mouth at bedtime. 30 tablet 5  . Norethindrone-Ethinyl Estradiol-Fe Biphas (LO LOESTRIN FE) 1 MG-10 MCG / 10 MCG tablet Take 1 tablet by mouth daily. 3 Package 3  . ondansetron (ZOFRAN ODT) 4 MG disintegrating tablet 4mg  ODT q4 hours prn nausea/vomit 20 tablet 0  . oxyCODONE (ROXICODONE) 5 MG immediate release tablet Take 1 tablet (5 mg total) by mouth every 4 (four) hours as needed for severe pain. 20 tablet 0  . Tretinoin, Facial Wrinkles, (TRETINOIN, EMOLLIENT,) 0.05 % CREA Apply 1 application topically at bedtime.     No current facility-administered medications for this visit.    Allergies as of 06/13/2015 - Review Complete 06/13/2015  Allergen Reaction Noted  . Prozac [fluoxetine hcl] Other (See Comments) 08/31/2011  . Ibuprofen Other (See Comments) 06/12/2015    History reviewed. No pertinent family history.  Social History   Social History  . Marital Status: Single    Spouse Name: N/A  . Number of Children: N/A  . Years of Education: N/A   Occupational History  . Not  on file.   Social History Main Topics  . Smoking status: Never Smoker   . Smokeless tobacco: Never Used  . Alcohol Use: 0.6 oz/week    1 Glasses of wine per week  . Drug Use: No  . Sexual Activity: No     Comment: No sex past 4-5 months   Other Topics Concern  . Not on file   Social History Narrative   Marital status: single; not dating.      Children: none      Education: [employer]      Lives: in [place]; stays on campus.      Tobacco; none      Alcohol: socially      Drugs: none      Exercise: none     Physical Exam: BP 110/62 mmHg  Pulse 68  Ht 5\' 6"  (1.676 m)  Wt 121 lb 12.8 oz (55.248 kg)  BMI 19.67  kg/m2  LMP 05/31/2015 Constitutional: generally well-appearing Psychiatric: alert and oriented x3 Eyes: extraocular movements intact Mouth: oral pharynx moist, no lesions Neck: supple no lymphadenopathy Cardiovascular: heart regular rate and rhythm Lungs: clear to auscultation bilaterally Abdomen: soft, nontender, nondistended, no obvious ascites, no peritoneal signs, normal bowel sounds Extremities: no lower extremity edema bilaterally Skin: no lesions on visible extremities   Assessment and plan: 24 y.o. female with  abdominal pain, abnormal: CT scan  Seems most likely she has an infectious colitis here. She was started on antibiotics, doxycycline, about 3 weeks ago. Perhaps she has C. difficile. We will get stool testing for that. She will continue her previously prescribed antibiotics for now. I do think it is important given the severity of her colon abnormality to undergo colonoscopy but I would like to wait until her acute illness subsides, we'll plan for 5-6 weeks from now. Certainly if she worsens or fails to improve in the next 3-5 days as I expect that we could speed this up. She has a large fibroid in her uterus, 8 cm, I recommended she follow-up with her gynecologist for this.   Owens Loffler, MD [facility] 06/13/2015, 2:21 PM  Cc: Leandrew Koyanagi, MD

## 2015-06-13 NOTE — Telephone Encounter (Signed)
patient mom left message with answering service stating that patient was seen in the ED and was told to that she needs to see a GI doctor today. Dr. Hilarie Fredrickson is Doc of Day. patient mom states that patient was seen for colotis. Please advise as to scheduling.

## 2015-06-13 NOTE — Telephone Encounter (Signed)
Referral made already.  

## 2015-06-13 NOTE — Telephone Encounter (Signed)
Pt mom called stating that she needs a referral done for a Gastro 06/13/15 Her daughter was seen at the ER and they suggested she have a referral done today with [facility] or [facility] for a colonoscopy.

## 2015-06-13 NOTE — Telephone Encounter (Signed)
Referrals

## 2015-06-13 NOTE — Telephone Encounter (Signed)
Pt was seen in the ER, CT scan was done. See results:  IMPRESSION: 1. Severe inflammatory or infectious colitis. This is mainly involving the transverse colon but also the adjacent aspects of the ascending and descending colon. 2. Scattered mesenteric and retroperitoneal lymph nodes but no mass or adenopathy. 3. Large fibroid involving the posterior myometrium  Pts mother states they were told to follow-up with GI today. We are on unassigned call at [facility] in December. Dr. Hilarie Fredrickson as doc of the day please advise.

## 2015-06-13 NOTE — Telephone Encounter (Signed)
Patients dad Grace Campbell calling requesting his daughter to be referred to [facility] for colotis. Per dad their office is requesting a referral from [place] where patient was seen yesterday 06/12/15. He stated she needs this to be done asap so she can be seen at [facility]. Any questions please call Johnny at [phone]

## 2015-06-13 NOTE — Patient Instructions (Signed)
Stay hydrated. You will be set up for a colonoscopy in 5-6 weeks for abnormal colon, diarrhea. Stay on the antibiotics you were given previously. You should be feeling better in 3-5 days, if not please call. Follow up with OB for your large fibroid.

## 2015-06-14 LAB — CLOSTRIDIUM DIFFICILE BY PCR: Toxigenic C. Difficile by PCR: NOT DETECTED

## 2015-06-17 ENCOUNTER — Telehealth: Payer: Self-pay | Admitting: Internal Medicine

## 2015-06-17 NOTE — Telephone Encounter (Signed)
Pt was wondering if Dr. Sharlet Salina can work her in, she was refer from Dr. Ardis Hughs in GI, pt has information in her colon and he want her to be see sooner that the 07/15/15. Please advise.   Phone # [phone]

## 2015-06-18 ENCOUNTER — Telehealth: Payer: Self-pay | Admitting: Gastroenterology

## 2015-06-18 NOTE — Telephone Encounter (Signed)
Pt has been notified to advance her diet as tolerated.  She will call if she has any concerns or problems as she begins to eat her normal foods.

## 2015-06-18 NOTE — Telephone Encounter (Signed)
Pt aware.

## 2015-06-18 NOTE — Telephone Encounter (Signed)
I would recommend to keep the January appointment. Unfortunately with the holidays we would not be able to move her up much. Upon review of her chart her only medical problems are GI and she is already seeing the GI specialist. If she is having problems sooner she can call her GI doctor or her old PCP.

## 2015-06-18 NOTE — Telephone Encounter (Signed)
Pt notified that the oxy was not prescribed by Dr Ardis Hughs and she will call the original prescriber

## 2015-07-07 HISTORY — PX: COLONOSCOPY W/ BIOPSIES: SHX1374

## 2015-07-15 ENCOUNTER — Ambulatory Visit: Payer: BC Managed Care – PPO | Admitting: Internal Medicine

## 2015-07-16 ENCOUNTER — Ambulatory Visit (AMBULATORY_SURGERY_CENTER): Payer: BC Managed Care – PPO | Admitting: Gastroenterology

## 2015-07-16 ENCOUNTER — Encounter: Payer: Self-pay | Admitting: Gastroenterology

## 2015-07-16 VITALS — BP 128/90 | HR 70 | Temp 98.3°F | Resp 27 | Ht 66.0 in | Wt 121.0 lb

## 2015-07-16 DIAGNOSIS — R197 Diarrhea, unspecified: Secondary | ICD-10-CM | POA: Diagnosis not present

## 2015-07-16 DIAGNOSIS — R933 Abnormal findings on diagnostic imaging of other parts of digestive tract: Secondary | ICD-10-CM | POA: Diagnosis not present

## 2015-07-16 DIAGNOSIS — R194 Change in bowel habit: Secondary | ICD-10-CM

## 2015-07-16 DIAGNOSIS — R1084 Generalized abdominal pain: Secondary | ICD-10-CM | POA: Diagnosis present

## 2015-07-16 MED ORDER — SODIUM CHLORIDE 0.9 % IV SOLN: 500.0000 mL | INTRAVENOUS | Status: DC

## 2015-07-16 NOTE — Progress Notes (Signed)
Called to room to assist during endoscopic procedure.  Patient ID and intended procedure confirmed with present staff. Received instructions for my participation in the procedure from the performing physician.  

## 2015-07-16 NOTE — Progress Notes (Signed)
Pt stable to RR 

## 2015-07-16 NOTE — Patient Instructions (Signed)
YOU HAD AN ENDOSCOPIC PROCEDURE TODAY AT THE [facility]:   Refer to the procedure report that was given to you for any specific questions about what was found during the examination.  If the procedure report does not answer your questions, please call your gastroenterologist to clarify.  If you requested that your care partner not be given the details of your procedure findings, then the procedure report has been included in a sealed envelope for you to review at your convenience later.  YOU SHOULD EXPECT: Some feelings of bloating in the abdomen. Passage of more gas than usual.  Walking can help get rid of the air that was put into your GI tract during the procedure and reduce the bloating. If you had a lower endoscopy (such as a colonoscopy or flexible sigmoidoscopy) you may notice spotting of blood in your stool or on the toilet paper. If you underwent a bowel prep for your procedure, you may not have a normal bowel movement for a few days.  Please Note:  You might notice some irritation and congestion in your nose or some drainage.  This is from the oxygen used during your procedure.  There is no need for concern and it should clear up in a day or so.  SYMPTOMS TO REPORT IMMEDIATELY:   Following lower endoscopy (colonoscopy or flexible sigmoidoscopy):  Excessive amounts of blood in the stool  Significant tenderness or worsening of abdominal pains  Swelling of the abdomen that is new, acute  Fever of 100F or higher   For urgent or emergent issues, a gastroenterologist can be reached at any hour by calling [phone].   DIET: Your first meal following the procedure should be a small meal and then it is ok to progress to your normal diet. Heavy or fried foods are harder to digest and may make you feel nauseous or bloated.  Likewise, meals heavy in dairy and vegetables can increase bloating.  Drink plenty of fluids but you should avoid alcoholic beverages for 24  hours.  ACTIVITY:  You should plan to take it easy for the rest of today and you should NOT DRIVE or use heavy machinery until tomorrow (because of the sedation medicines used during the test).    FOLLOW UP: Our staff will call the number listed on your records the next business day following your procedure to check on you and address any questions or concerns that you may have regarding the information given to you following your procedure. If we do not reach you, we will leave a message.  However, if you are feeling well and you are not experiencing any problems, there is no need to return our call.  We will assume that you have returned to your regular daily activities without incident.  If any biopsies were taken you will be contacted by phone or by letter within the next 1-3 weeks.  Please call us at [phone] if you have not heard about the biopsies in 3 weeks.    SIGNATURES/CONFIDENTIALITY: You and/or your care partner have signed paperwork which will be entered into your electronic medical record.  These signatures attest to the fact that that the information above on your After Visit Summary has been reviewed and is understood.  Full responsibility of the confidentiality of this discharge information lies with you and/or your care-partner. 

## 2015-07-16 NOTE — Progress Notes (Signed)
Stable to RR 

## 2015-07-16 NOTE — Op Note (Signed)
[facility]  [address] [place], [address]   COLONOSCOPY PROCEDURE REPORT  PATIENT: Grace, Campbell  MR#: [id number] BIRTHDATE: 08/04/1990 , 24  yrs. old GENDER: female ENDOSCOPIST: Milus Banister, MD REFERRED [id number] Laney Pastor, M.D. PROCEDURE DATE:  07/16/2015 PROCEDURE:   Colonoscopy, diagnostic and Colonoscopy with biopsy First Screening Colonoscopy - Avg.  risk and is 50 yrs.  old or older - No.  Prior Negative Screening - Now for repeat screening. N/A  History of Adenoma - Now for follow-up colonoscopy & has been > or = to 3 yrs.  N/A  Polyps removed today? Yes ASA CLASS:   Class I INDICATIONS:abd pain, change in bowels, very abnormal colon on CT (severe inflammation in transverse), also large uterine fibroid. MEDICATIONS: Monitored anesthesia care, Propofol 200 mg IV, and lidocaine 40mg  IV  DESCRIPTION OF PROCEDURE:   After the risks benefits and alternatives of the procedure were thoroughly explained, informed consent was obtained.  The digital rectal exam revealed no abnormalities of the rectum.   The LB [id number]  endoscope was introduced through the anus and advanced to the terminal ileum which was intubated for a short distance. No adverse events experienced.   The quality of the prep was excellent.  The instrument was then slowly withdrawn as the colon was fully examined. Estimated blood loss is zero unless otherwise noted in this procedure report.   COLON FINDINGS: The terminal ileum was normal.  The colonic mucosa was normal throughout, was randomly biopsied and sent to pathology. Retroflexed views revealed no abnormalities. The time to cecum = 2.5 Withdrawal time = 5.9   The scope was withdrawn and the procedure completed. COMPLICATIONS: There were no immediate complications.  ENDOSCOPIC IMPRESSION: The terminal ileum was normal.  The colonic mucosa was normal throughout, was randomly biopsied and sent to  pathology  RECOMMENDATIONS: Await final pathology from colon biopsies.  The large (8cm) uterine fibroid may be pressing on the colon, causing altered bowel habits.   eSigned:  Milus Banister, MD 07/16/2015 3:56 PM

## 2015-07-16 NOTE — Progress Notes (Signed)
Pt's mother reported that her daughter has a large fibroid tumor attache in the back of her uterus.  Pt's mother said pt's OBGYN said she was concerned about pt having a colonoscopy d/t tumor.  I asked the pt about this and she said she thought her OBGYN did not think it would be a problem.  I explained this to Dr. Ardis Hughs and he said he did not thnk this would be a problem with performing the colonoscopy.  I asked pt id okay to report to pt's mother what Dr. Ardis Hughs said.  She said ok to speak with her mother. maw

## 2015-07-17 ENCOUNTER — Telehealth: Payer: Self-pay | Admitting: *Deleted

## 2015-07-17 NOTE — Telephone Encounter (Signed)
  Follow up Call-  Call back number 07/16/2015  Post procedure Call Back phone  # [phone] cell  Permission to leave phone message Yes     Patient questions:  Message box is full.

## 2015-07-24 ENCOUNTER — Encounter: Payer: Self-pay | Admitting: Gastroenterology

## 2015-07-30 ENCOUNTER — Ambulatory Visit: Payer: BC Managed Care – PPO | Admitting: Internal Medicine

## 2015-08-11 ENCOUNTER — Ambulatory Visit (INDEPENDENT_AMBULATORY_CARE_PROVIDER_SITE_OTHER): Payer: BC Managed Care – PPO | Admitting: Physician Assistant

## 2015-08-11 VITALS — BP 118/70 | Temp 97.9°F | Resp 16 | Ht 66.5 in | Wt 120.6 lb

## 2015-08-11 DIAGNOSIS — G44219 Episodic tension-type headache, not intractable: Secondary | ICD-10-CM

## 2015-08-11 DIAGNOSIS — E860 Dehydration: Secondary | ICD-10-CM | POA: Diagnosis not present

## 2015-08-11 DIAGNOSIS — R208 Other disturbances of skin sensation: Secondary | ICD-10-CM

## 2015-08-11 LAB — POCT URINALYSIS DIP (MANUAL ENTRY)
Bilirubin, UA: NEGATIVE
Blood, UA: NEGATIVE
Glucose, UA: NEGATIVE
LEUKOCYTES UA: NEGATIVE
NITRITE UA: NEGATIVE
Protein Ur, POC: NEGATIVE
Urobilinogen, UA: 0.2
pH, UA: 5.5

## 2015-08-11 LAB — POCT CBC
Granulocyte percent: 34.9 %G — AB (ref 37–80)
HEMATOCRIT: 39.4 % (ref 37.7–47.9)
Hemoglobin: 13.6 g/dL (ref 12.2–16.2)
LYMPH, POC: 3.2 (ref 0.6–3.4)
MCH, POC: 32.6 pg — AB (ref 27–31.2)
MCHC: 34.5 g/dL (ref 31.8–35.4)
MCV: 94.6 fL (ref 80–97)
MID (CBC): 0.3 (ref 0–0.9)
MPV: 6.7 fL (ref 0–99.8)
POC GRANULOCYTE: 1.8 — AB (ref 2–6.9)
POC LYMPH %: 59.8 % — AB (ref 10–50)
POC MID %: 5.3 % (ref 0–12)
Platelet Count, POC: 287 10*3/uL (ref 142–424)
RBC: 4.17 M/uL (ref 4.04–5.48)
RDW, POC: 13.8 %
WBC: 5.3 10*3/uL (ref 4.6–10.2)

## 2015-08-11 LAB — TSH: TSH: 0.965 u[IU]/mL (ref 0.350–4.500)

## 2015-08-11 LAB — POCT SEDIMENTATION RATE: POCT SED RATE: 10 mm/h (ref 0–22)

## 2015-08-11 LAB — C-REACTIVE PROTEIN

## 2015-08-11 LAB — POCT URINE PREGNANCY: PREG TEST UR: NEGATIVE

## 2015-08-11 MED ORDER — TRAMADOL-ACETAMINOPHEN 37.5-325 MG PO TABS: 1.0000 | ORAL_TABLET | Freq: Four times a day (QID) | ORAL | Status: DC | PRN

## 2015-08-11 MED ORDER — KETOROLAC TROMETHAMINE 60 MG/2ML IM SOLN
60.0000 mg | Freq: Once | INTRAMUSCULAR | Status: AC
  Administered 2015-08-11: 60 mg via INTRAMUSCULAR

## 2015-08-11 NOTE — Progress Notes (Signed)
08/12/2015 8:46 AM   DOB: 21-Dec-1990 / MRN: [id number]  SUBJECTIVE:  Grace Campbell is a 25 y.o. somber female presenting for a hedache.  This started last night and she felt that it started in her neck.  States the right side of the face  Is bothering her as well.  She has tired naprosyn for the HA with some relief.  Also complains of some skin tenderness of right proximal shin.    No new medications.  No history of neurological disease. She not had a full cycle since November 24th.  She was on birth control up until January of this year and has not had a full period yet, however complains of some spotting.    She is allergic to prozac and ibuprofen.   She  has a past medical history of Medical history non-contributory; Allergy; Anemia; Depression; Anxiety; Arthritis; Asthma; GERD (gastroesophageal reflux disease); and Fibroids.    She  reports that she has never smoked. She has never used smokeless tobacco. She reports that she drinks about 0.6 oz of alcohol per week. She reports that she does not use illicit drugs. She  reports that she does not engage in sexual activity. The patient  has past surgical history that includes No past surgeries and wisdom teeth extracted.  Her family history includes Colon cancer in her maternal aunt. There is no history of Esophageal cancer, Rectal cancer, or Stomach cancer.  Review of Systems  Constitutional: Negative for fever and chills.  HENT: Positive for congestion. Negative for sore throat.   Eyes: Negative for blurred vision.  Respiratory: Negative for cough and shortness of breath.   Cardiovascular: Negative for chest pain.  Gastrointestinal: Negative for nausea and abdominal pain.  Genitourinary: Negative for dysuria, urgency and frequency.  Musculoskeletal: Negative for myalgias.  Skin: Negative for rash.  Neurological: Positive for headaches. Negative for dizziness and tingling.  Psychiatric/Behavioral: Negative for depression. The patient is  not nervous/anxious.     Problem list and medications reviewed and updated by myself where necessary, and exist elsewhere in the encounter.   OBJECTIVE:  BP 118/70 mmHg  Temp(Src) 97.9 F (36.6 C) (Oral)  Resp 16  Ht 5' 6.5" (1.689 m)  Wt 120 lb 9.6 oz (54.704 kg)  BMI 19.18 kg/m2  LMP 08/06/2015  Physical Exam  HENT:  Head:    Cardiovascular: Normal rate and regular rhythm.   Pulmonary/Chest: Effort normal and breath sounds normal.  Abdominal: She exhibits no distension and no mass. There is no tenderness. There is no rebound and no guarding.  Musculoskeletal: Normal range of motion.  Skin: Skin is warm and dry.  Psychiatric:  Seen napping at the start of my assessment with her. Well groomed and appears somewhat fatigued.  Mood is "okay." She has a very serious and somber affect.  Good eye contact.  No stereotypies. Thought process in linear and logical.  Negative for delusions and hallucinations. Negative for SI/HI.    Nursing note and vitals reviewed.   Results for orders placed or performed in visit on 08/11/15 (from the past 72 hour(s))  C-reactive protein     Status: None   Collection Time: 08/11/15  3:16 PM  Result Value Ref Range   CRP <0.5 <0.60 mg/dL  POCT CBC     Status: Abnormal   Collection Time: 08/11/15  3:19 PM  Result Value Ref Range   WBC 5.3 4.6 - 10.2 K/uL   Lymph, poc 3.2 0.6 - 3.4   POC  LYMPH PERCENT 59.8 (A) 10 - 50 %L   MID (cbc) 0.3 0 - 0.9   POC MID % 5.3 0 - 12 %M   POC Granulocyte 1.8 (A) 2 - 6.9   Granulocyte percent 34.9 (A) 37 - 80 %G   RBC 4.17 4.04 - 5.48 M/uL   Hemoglobin 13.6 12.2 - 16.2 g/dL   HCT, POC 39.4 37.7 - 47.9 %   MCV 94.6 80 - 97 fL   MCH, POC 32.6 (A) 27 - 31.2 pg   MCHC 34.5 31.8 - 35.4 g/dL   RDW, POC 13.8 %   Platelet Count, POC 287 142 - 424 K/uL   MPV 6.7 0 - 99.8 fL  POCT urinalysis dipstick     Status: Abnormal   Collection Time: 08/11/15  3:20 PM  Result Value Ref Range   Color, UA yellow yellow    Clarity, UA clear clear   Glucose, UA negative negative   Bilirubin, UA negative negative   Ketones, POC UA trace (5) (A) negative   Spec Grav, UA >=1.030    Blood, UA negative negative   pH, UA 5.5    Protein Ur, POC negative negative   Urobilinogen, UA 0.2    Nitrite, UA Negative Negative   Leukocytes, UA Negative Negative  POCT urine pregnancy     Status: None   Collection Time: 08/11/15  3:20 PM  Result Value Ref Range   Preg Test, Ur Negative Negative  TSH     Status: None   Collection Time: 08/11/15  3:28 PM  Result Value Ref Range   TSH 0.965 0.350 - 4.500 uIU/mL  POCT SEDIMENTATION RATE     Status: None   Collection Time: 08/11/15  4:20 PM  Result Value Ref Range   POCT SED RATE 10 0 - 22 mm/hr    No results found.  ASSESSMENT AND PLAN  Eron was seen today for headache.  Diagnoses and all orders for this visit:  Episodic tension-type headache, not intractable: Somber 25 year old female initially sound asleep upon the start of my assessment with her.  She has an odd complaint of skin tenderness however there is no rash, swelling, or any visible abnormality.  Her inflammatory markers are normal.  Doubt neurological phenomena given that dermatomes do not behave in this manner.  She is dehydrated but reports drinking copious amounts of fluid.  Will add on A1c, however there is no glucose in her urine so doubt diabetes. There may be an underlying mood disorder at play.   -     POCT CBC -     ketorolac (TORADOL) injection 60 mg; Inject 2 mLs (60 mg total) into the muscle once. -     POCT urinalysis dipstick -     POCT urine pregnancy -     TSH  Localized skin tenderness -     POCT SEDIMENTATION RATE -     C-reactive protein  Dehydration:  Advised copious po fluids.    Other orders -     traMADol-acetaminophen (ULTRACET) 37.5-325 MG tablet; Take 1 tablet by mouth every 6 (six) hours as needed.    The patient was advised to call or return to clinic if she does not  see an improvement in symptoms or to seek the care of the closest emergency department if she worsens with the above plan.   Philis Fendt, MHS, PA-C [facility] 08/12/2015 8:46 AM

## 2015-08-12 ENCOUNTER — Telehealth: Payer: Self-pay

## 2015-08-12 NOTE — Telephone Encounter (Signed)
PATIENT WAS IN THE OFFICE TO SEE MICHAEL CLARK ON Sunday. SHE IS REQUESTING HER LAB RESULTS. BEST PHONE [phone] (CELL) PHARMACY CHOICE IS [facility] ON [address]

## 2015-08-12 NOTE — Telephone Encounter (Signed)
Pt notified of labs

## 2015-08-20 ENCOUNTER — Ambulatory Visit (INDEPENDENT_AMBULATORY_CARE_PROVIDER_SITE_OTHER): Payer: BC Managed Care – PPO | Admitting: Family Medicine

## 2015-08-20 VITALS — BP 124/78 | HR 102 | Temp 99.0°F | Resp 16 | Ht 66.5 in | Wt 122.4 lb

## 2015-08-20 DIAGNOSIS — J209 Acute bronchitis, unspecified: Secondary | ICD-10-CM

## 2015-08-20 MED ORDER — BENZONATATE 200 MG PO CAPS: 200.0000 mg | ORAL_CAPSULE | Freq: Three times a day (TID) | ORAL | Status: DC | PRN

## 2015-08-20 MED ORDER — ACETAMINOPHEN-CODEINE #3 300-30 MG PO TABS: 1.0000 | ORAL_TABLET | ORAL | Status: DC | PRN

## 2015-08-20 NOTE — Progress Notes (Signed)
  Subjective:     Grace Campbell is a 25 y.o. female here for evaluation of a cough. Onset of symptoms was 4 days ago. Symptoms have been unchanged since that time. The cough is nonproductive and is aggravated by nothing. Associated symptoms include: None. Patient does not have a history of asthma. Patient does not have a history of environmental allergens. Patient has not traveled recently. Patient does not have a history of smoking. Denies true fever, chills, night sweats, sinus pressure or pain, HA, blurred vision, diplopia. Does have concern for pertussis as 4-5 colleagues have been confirmed to have this.     The following portions of the patient's history were reviewed and updated as appropriate: allergies, current medications, past family history, past medical history, past social history, past surgical history and problem list.  Review of Systems Pertinent items are noted in HPI.    Objective:    Oxygen saturation 98% on room air BP 124/78 mmHg  Pulse 102  Temp(Src) 99 F (37.2 C) (Oral)  Resp 16  Ht 5' 6.5" (1.689 m)  Wt 122 lb 6.4 oz (55.52 kg)  BMI 19.46 kg/m2  SpO2 96%  LMP 08/06/2015 General appearance: alert, cooperative and appears stated age Head: Normocephalic, without obvious abnormality, atraumatic Nose: Nares normal. Septum midline. Mucosa normal. No drainage or sinus tenderness. Lungs: clear to auscultation bilaterally Heart: regular rate and rhythm, S1, S2 normal, no murmur, click, rub or gallop    Assessment:    Cough  DDx includes URI, acute bronchitis, pertussis.  Highly doubt last one as most recent TDAP was in the past 2-3 years as well as no paroxysmal cough.  Highly doubt influenza as well as no true fever or nausea, vomiting.    Plan:    Explained lack of efficacy of antibiotics in viral disease. Antitussives per medication orders. Avoid exposure to tobacco smoke and fumes. B-agonist inhaler. Call if shortness of breath worsens, blood in sputum,  change in character of cough, development of fever or chills, inability to maintain nutrition and hydration. Avoid exposure to tobacco smoke and fumes.   Discussed the differential and the chance of this being pertussis is extremely slim and pt is amenable to not testing for this as no paroxysmal coughing fits/recent TDAP.  We will give her medication for cough to tx acute bronchitis and if no improvement in 7-10 days, let us know.

## 2015-09-01 ENCOUNTER — Other Ambulatory Visit: Payer: Self-pay | Admitting: Physician Assistant

## 2015-10-21 ENCOUNTER — Other Ambulatory Visit: Payer: Self-pay | Admitting: Physician Assistant

## 2016-01-20 ENCOUNTER — Telehealth: Payer: Self-pay | Admitting: Gastroenterology

## 2016-01-20 NOTE — Telephone Encounter (Signed)
Pt has an appt tomorrow at her PCP and will follow up with GI as needed.

## 2016-01-21 ENCOUNTER — Ambulatory Visit (HOSPITAL_BASED_OUTPATIENT_CLINIC_OR_DEPARTMENT_OTHER)
Admission: RE | Admit: 2016-01-21 | Discharge: 2016-01-21 | Disposition: A | Discharge status: Home / Self Care | Payer: BC Managed Care – PPO | Source: Ambulatory Visit | Attending: Family Medicine | Admitting: Family Medicine

## 2016-01-21 ENCOUNTER — Encounter: Payer: Self-pay | Admitting: Family Medicine

## 2016-01-21 ENCOUNTER — Telehealth: Payer: Self-pay | Admitting: Family Medicine

## 2016-01-21 ENCOUNTER — Ambulatory Visit (INDEPENDENT_AMBULATORY_CARE_PROVIDER_SITE_OTHER): Payer: BC Managed Care – PPO | Admitting: Family Medicine

## 2016-01-21 VITALS — BP 131/79 | HR 98 | Temp 98.2°F | Resp 18 | Ht 67.0 in | Wt 113.2 lb

## 2016-01-21 DIAGNOSIS — Z7189 Other specified counseling: Secondary | ICD-10-CM | POA: Diagnosis not present

## 2016-01-21 DIAGNOSIS — R634 Abnormal weight loss: Secondary | ICD-10-CM | POA: Insufficient documentation

## 2016-01-21 DIAGNOSIS — R110 Nausea: Secondary | ICD-10-CM | POA: Insufficient documentation

## 2016-01-21 DIAGNOSIS — Z681 Body mass index (BMI) 19 or less, adult: Secondary | ICD-10-CM

## 2016-01-21 DIAGNOSIS — Z7689 Persons encountering health services in other specified circumstances: Secondary | ICD-10-CM

## 2016-01-21 DIAGNOSIS — R109 Unspecified abdominal pain: Secondary | ICD-10-CM | POA: Diagnosis not present

## 2016-01-21 LAB — CBC WITH DIFFERENTIAL/PLATELET
BASOS ABS: 38 {cells}/uL (ref 0–200)
Basophils Relative: 1 %
EOS ABS: 38 {cells}/uL (ref 15–500)
Eosinophils Relative: 1 %
HCT: 38.2 % (ref 35.0–45.0)
Hemoglobin: 13 g/dL (ref 11.7–15.5)
LYMPHS PCT: 59 %
Lymphs Abs: 2242 cells/uL (ref 850–3900)
MCH: 31.1 pg (ref 27.0–33.0)
MCHC: 34 g/dL (ref 32.0–36.0)
MCV: 91.4 fL (ref 80.0–100.0)
MONOS PCT: 7 %
MPV: 9.2 fL (ref 7.5–12.5)
Monocytes Absolute: 266 cells/uL (ref 200–950)
NEUTROS PCT: 32 %
Neutro Abs: 1216 cells/uL — ABNORMAL LOW (ref 1500–7800)
PLATELETS: 295 10*3/uL (ref 140–400)
RBC: 4.18 MIL/uL (ref 3.80–5.10)
RDW: 13.6 % (ref 11.0–15.0)
WBC: 3.8 10*3/uL (ref 3.8–10.8)

## 2016-01-21 LAB — C-REACTIVE PROTEIN: CRP: <0.5 mg/dL (ref <0.60)

## 2016-01-21 LAB — POCT URINE PREGNANCY: PREG TEST UR: NEGATIVE

## 2016-01-21 LAB — COMPREHENSIVE METABOLIC PANEL
ALK PHOS: 40 U/L (ref 33–115)
ALT: 15 U/L (ref 6–29)
AST: 19 U/L (ref 10–30)
Albumin: 4.6 g/dL (ref 3.6–5.1)
BUN: 10 mg/dL (ref 7–25)
CO2: 24 mmol/L (ref 20–31)
Calcium: 9.4 mg/dL (ref 8.6–10.2)
Chloride: 103 mmol/L (ref 98–110)
Creat: 0.86 mg/dL (ref 0.50–1.10)
GLUCOSE: 85 mg/dL (ref 65–99)
POTASSIUM: 4 mmol/L (ref 3.5–5.3)
SODIUM: 139 mmol/L (ref 135–146)
Total Bilirubin: 2.2 mg/dL — ABNORMAL HIGH (ref 0.2–1.2)
Total Protein: 7.2 g/dL (ref 6.1–8.1)

## 2016-01-21 LAB — TSH: TSH: 0.73 m[IU]/L

## 2016-01-21 MED ORDER — POLYETHYLENE GLYCOL 3350 17 G PO PACK: 17.0000 g | PACK | Freq: Every day | ORAL | Status: DC

## 2016-01-21 NOTE — Progress Notes (Signed)
Patient ID: Grace Campbell, female   DOB: 05-Nov-1990, 25 y.o.   MRN: [id number]      Patient ID: Grace Campbell, female  DOB: 05/18/1991, 25 y.o.   MRN: [id number] Patient Care Team    Relationship Specialty Notifications Start End  Ma Hillock, DO PCP - General Family Medicine  01/21/16   Thurnell Lose, MD Consulting Physician Obstetrics and Gynecology  01/21/16   Milus Banister, MD Attending Physician Gastroenterology  01/21/16     Subjective:  Grace Campbell is a 25 y.o.  female present for new patient establishment with complaints of abd pain.  All past medical history, surgical history, allergies, family history, immunizations, medications and social history were obtained in the electronic medical record today. All recent labs, ED visits and hospitalizations within the last year were reviewed. Uterine fibroid, severe inflammatory infectious colitis.   RUQ pain/nausea: Patient states she has been having ongoing GI symptoms since her ED visit for infectious colitis in December. She states she has RUQ pain that is a constant mild pain and occasional sharp pain. Her ribs will click when she stretches and then she will need to have a BM. She describes the "sharp" pain has a crampy sharp pain, and feels like a "knot" in her stomach. She states she was having a daily BM up until about 2 weeks ago. She now has not had a BM in 1 week. Her last BM (she has a picture) is large caliber stool, with smaller caliber mucous/pink tinged stool that followed. Patient states for a few days she did not pass gas, but she has now. She reports she drinks 8 bottles of water a day. She denies fever, chills. She endorses decreased appetite, nausea without vomit. She states she is just not able to eat well anymore.   She has not taken anything for her constipation. She drinks at least 8 bottles of water a day. She has a h/o large fibroid and amenorrhea since November, in which she is under the care of [facility]. She  states the fibroid was 9 cm and she had lupron injections, and now the fibroid is 7 cm. She is scheduling surgery to have fibroid removed. She has had 9 lbs of weight loss in 5 months. She does admit to changing her diet after the colitis infection. She now does not eat dairy or red meat. She denies fever, chills, night sweats or dysuria. She denies bloody stools. She brings a picture of her last BM because it was "odd". She endorses frequent belching. She doe snot take NSAIDS because they hurt her stomach.   There is no immunization history on file for this patient.   Past Medical History  Diagnosis Date  . Allergy   . Anemia   . Depression     has been followed by psych in the past, Dr. Rolm Gala.   Marland Kitchen Anxiety   . Arthritis   . Asthma   . GERD (gastroesophageal reflux disease)   . Fibroids   . Migraines   . Chicken pox   . H. pylori infection 2013   Allergies  Allergen Reactions  . Prozac [Fluoxetine Hcl] Other (See Comments)    Suicidal thoughts  . Ibuprofen Other (See Comments)    Causes ulcers   Past Surgical History  Procedure Laterality Date  . Wisdom teeth extracted  2008, 2011  . Colonoscopy w/ biopsies  2017   Family History  Problem Relation Age of Onset  . Colon  cancer Maternal Aunt   . Esophageal cancer Neg Hx   . Rectal cancer Neg Hx   . Stomach cancer Neg Hx   . Alcohol abuse Maternal Grandfather   . Diabetes Paternal Grandmother   . Alcohol abuse Maternal Uncle    Social History   Social History  . Marital Status: Single    Spouse Name: N/A  . Number of Children: N/A  . Years of Education: N/A   Occupational History  . Not on file.   Social History Main Topics  . Smoking status: Never Smoker   . Smokeless tobacco: Never Used  . Alcohol Use: 0.6 oz/week    1 Glasses of wine per week  . Drug Use: No  . Sexual Activity: No     Comment: No sex past 4-5 months   Other Topics Concern  . Not on file   Social History Narrative   Marital  status: single; not dating.      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       Lives: with parents.       Tobacco; none      Alcohol: socially      Drugs: none      Exercise: none   Drinks caffeine   Wears her seatbelt.   Does not eat red meat or dairy products.    Smoke detector in the home   Feels safe in her relationships.         Medication List       This list is accurate as of: 01/21/16  5:54 PM.  Always use your most recent med list.               clindamycin 1 % external solution  Commonly known as:  CLEOCIN T  Apply 1 application topically every morning.     polyethylene glycol packet  Commonly known as:  MIRALAX / GLYCOLAX  Take 17 g by mouth daily.     tretinoin 0.05 % cream  Commonly known as:  RETIN-A  APPLY TO FACE NIGHTLY TO TOLERANCE.         Recent Results (from the past 2160 hour(s))  POCT urine pregnancy     Status: None   Collection Time: 01/21/16  2:45 PM  Result Value Ref Range   Preg Test, Ur Negative Negative    Dg Abd 1 View  06/12/2015  CLINICAL DATA:  Abdominal pain. EXAM: ABDOMEN - 1 VIEW COMPARISON:  06/15/2009 . FINDINGS: Soft tissue structures are unremarkable. Several air-filled loops of slightly prominent small bowel are noted. There is mild small bowel wall fold thickening. Active small-bowel pathology including enteritis could present in this fashion. Continued follow-up exam suggested to demonstrate clearing and to exclude developing bowel obstruction. Colonic gas pattern is unremarkable. No free air. No acute bony abnormality. Pelvic calcifications consistent phleboliths . IMPRESSION: Several air-filled loops of slightly prominent small bowel are noted. There is mild small bowel wall fold thickening. Active small bowel pathology including enteritis could present in this fashion. Continued follow-up exam suggested to demonstrate clearing and to  exclude developing bowel obstruction. Electronically Signed   By: Marcello Moores  Register   On: 06/12/2015 10:14   Ct Abdomen Pelvis W Contrast  06/12/2015  CLINICAL DATA:  Bilateral lower abdominal pain, nausea and vomiting for 1 day. EXAM: CT ABDOMEN AND PELVIS WITH CONTRAST TECHNIQUE: Multidetector CT imaging of the abdomen and pelvis  was performed using the standard protocol following bolus administration of intravenous contrast. CONTRAST:  14m OMNIPAQUE IOHEXOL 300 MG/ML  SOLN COMPARISON:  None. FINDINGS: Lower chest: The lung bases are clear. No pleural effusion or pulmonary nodule. The heart is normal in size. No pericardial effusion. The distal esophagus is grossly normal. Hepatobiliary: No focal hepatic lesions or intrahepatic biliary dilatation. The gallbladder is normal. No common bile duct dilatation. Pancreas: No mass, inflammation or ductal dilatation. Spleen: Normal size.  No focal lesions. Adrenals/Urinary Tract: The adrenal glands and kidneys are normal. Stomach/Bowel: The stomach, duodenum, and small bowel are unremarkable. No inflammatory changes, mass lesions or obstructive findings. Severe inflammatory or infectious process involving the upper aspect of the ascending colon, the transverse colon and the upper aspect of the descending colon. The sigmoid colon rectum appear normal. The terminal ileum is normal. The appendix is normal. Vascular/Lymphatic: No mesenteric or retroperitoneal mass or adenopathy. Scattered lymph nodes are noted. Reproductive: There is a large uterine fibroid measuring 8 x 5.5 cm. The ovaries are normal. Other: No pelvic mass or adenopathy. Small amount of free pelvic fluid. No abdominal wall hernia or subcutaneous lesions. Musculoskeletal: No significant bony findings. IMPRESSION: 1. Severe inflammatory or infectious colitis. This is mainly involving the transverse colon but also the adjacent aspects of the ascending and descending colon. 2. Scattered mesenteric and  retroperitoneal lymph nodes but no mass or adenopathy. 3. Large fibroid involving the posterior myometrium. Electronically Signed   By: PMarijo SanesM.D.   On: 06/12/2015 16:31     ROS: 14 pt review of systems performed and negative (unless mentioned in an HPI)  Objective: BP 131/79 mmHg  Pulse 98  Temp(Src) 98.2 F (36.8 C)  Resp 18  Ht 5' 7"  (1.702 m)  Wt 113 lb 4 oz (51.37 kg)  BMI 17.73 kg/m2  SpO2 98%  LMP 05/24/2015 Gen: Afebrile. No acute distress. Nontoxic in appearance, well-developed, well-nourished, thin, AAF.  HENT: AT. [place].MMM  Eyes:Pupils Equal Round Reactive to light, Extraocular movements intact,  Conjunctiva without redness, discharge or icterus. Neck/lymp/endocrine: Supple,no  lymphadenopathy, no thyromegaly CV: RRR no murmur, no edema, +2/4 P posterior tibialis pulses. Chest: CTAB, no wheeze, rhonchi or crackles.  Abd: Soft.thin. ND. TTP epigastric,  BS present. Fibroid vs stool burden palpated. No hepatosplenomegaly. No rebound tenderness or guarding. Negative murphy's sign.  Skin:  Warm and well-perfused. Skin intact. Neuro/Msk: Normal gait. PERLA. EOMi. Alert. Oriented x3.   Psych: mildly flat. Otherwise Normal speech. Normal thought content and judgment.   Results for orders placed or performed in visit on 01/21/16 (from the past 24 hour(s))  POCT urine pregnancy     Status: None   Collection Time: 01/21/16  2:45 PM  Result Value Ref Range   Preg Test, Ur Negative Negative   Assessment/plan: SABAGAYLE KLUTTSis a 25y.o. female present for establishment of care and abd discomfort. Abdominal pain, unspecified abdominal location/weight loss/nausea/decreased appetite./BMI <19 - pt with known large posterior uterine fibroid by CT.  Pt  Has not had a menstrual cycle since 05/2016, negative preg test today. Next step would be Ultrasound, however pt state she [place] [facility] [place] [facility] are [facility] removal of fibroid. If large enough could  be causing some obstruction to large stools. The picture the patient brought of her stool, appears normal/slightly hardened large stool with smaller caliber mucous/pink tinged stool after.  - constipation vs prior obstruction? Vs ileus  - Colonoscopy and GI  work up in January reassuring. However continued symptoms and weight loss concerning. Per emr h/o H.pylori infection 2013. - consider PPI  - DG Abd 2 Views; Future - POCT urine pregnancy--> negative  - TSH  - CBC w/Diff - Sed Rate (ESR) - Comp Met (CMET) - C-reactive protein - Hemoccult Cards (X3 cards); Future - Encouraged daily miralax to keep stools soft but normal, if diarrhea occurs to stop use and start stool softener. Labs and xray ordered today. Pt will be called with results once all available.  - F/U 1 week or sooner if symptoms worsen.   Greater than 45 minutes was spent with patient, greater than 50% of that time was spent face-to-face with patient counseling and coordinating care.  No Follow-up on file.  Electronically signed by: Howard Pouch, DO [place]

## 2016-01-21 NOTE — Patient Instructions (Signed)
[address], [facility].--> please go get xray today.  I have called in miralax for you to start daily with 8 ounces of water to regulate bowels. Once having daily bowel movements start using an over the counter stool softener (you can ask your pharmacist to guide you on this).  Complete the cards I gave you and return them to office by mail or dropping them off.  I will call you with results of all studies once available.

## 2016-01-21 NOTE — Telephone Encounter (Signed)
Please call pt: - it appears that pt is constipated, with a fair amount of stool burden. If she does not get relief from the miralax could also consider calling in suppository for her if pain worsens or she does not have a BM within the next 24 hours.  - We call her once labs returned as well.

## 2016-01-22 LAB — SEDIMENTATION RATE: SED RATE: 4 mm/h (ref 0–20)

## 2016-01-22 NOTE — Telephone Encounter (Signed)
Spoke with patient reviewed lab results and instructions with patient. Patient verbalized understanding . 

## 2016-01-23 ENCOUNTER — Telehealth: Payer: Self-pay | Admitting: Family Medicine

## 2016-01-23 DIAGNOSIS — Z8719 Personal history of other diseases of the digestive system: Secondary | ICD-10-CM | POA: Insufficient documentation

## 2016-01-23 MED ORDER — OMEPRAZOLE 40 MG PO CPDR: 40.0000 mg | DELAYED_RELEASE_CAPSULE | Freq: Every day | ORAL | Status: DC

## 2016-01-23 MED ORDER — ONDANSETRON HCL 4 MG PO TABS: 4.0000 mg | ORAL_TABLET | Freq: Three times a day (TID) | ORAL | Status: DC | PRN

## 2016-01-23 NOTE — Telephone Encounter (Signed)
Please call pt: - her labs are normal with the exception of her bilirubin. This has been elevated for her in the past (December) as well ,by reviewing her past labs.  - Considering she is still having symptoms, bilirubin  Elevated and she has lost weight, I would encourage her to follow with her GI doctor as soon as possible.  - I have also called in zofran and omeprazole to help with the nausea/burning sensation in her stomach.

## 2016-01-23 NOTE — Telephone Encounter (Signed)
Spoke with patient reviewed recent lab results and instructions for follow up with GI. Patient verbalized understanding.

## 2016-01-23 NOTE — Telephone Encounter (Signed)
Please see prior note dated today. Closed before sent.

## 2016-01-29 ENCOUNTER — Telehealth: Payer: Self-pay | Admitting: Family Medicine

## 2016-01-29 ENCOUNTER — Other Ambulatory Visit: Payer: BC Managed Care – PPO

## 2016-01-29 DIAGNOSIS — R109 Unspecified abdominal pain: Secondary | ICD-10-CM

## 2016-01-29 LAB — HEMOCCULT SLIDES (X 3 CARDS)
FECAL OCCULT BLD: NEGATIVE
OCCULT 1: NEGATIVE
OCCULT 2: NEGATIVE
OCCULT 3: NEGATIVE
OCCULT 4: NEGATIVE
OCCULT 5: NEGATIVE

## 2016-01-29 NOTE — Telephone Encounter (Signed)
Spoke with patient reviewed lab results. 

## 2016-01-29 NOTE — Telephone Encounter (Signed)
Please call pt - her hemoccult cards are negative for blood.

## 2016-03-06 ENCOUNTER — Encounter: Payer: Self-pay | Admitting: Family Medicine

## 2016-03-12 NOTE — H&P (Signed)
Grace Campbell is a 25 y.o. female , originally referred to me by Dr. Simona Huh, for uterine fibroids and menorrhagia.  She was diagnosed with fibroids because of abnormal uterine bleeding and was placed on Lupron Depot injections. 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 Patient would like to preserve her childbearing potential.  Pertinent Gynecological History: Menses: flow is excessive with use of 3 pads or tampons on heaviest days Bleeding: dysfunctional uterine bleeding Contraception: Lupron Depot DES exposure: denies Blood transfusions: none Sexually transmitted diseases: no past history Previous GYN Procedures: None  Last mammogram: normal Last pap: normal  OB History: G2, SAB1, TOP1   Menstrual History: Menarche age: 21 No LMP recorded.    Past Medical History:  Diagnosis Date  . Allergy   . Anemia   . Anxiety   . Arthritis   . Asthma   . Chicken pox   . Colitis   . Depression    has been followed by psych in the past, Dr. Rolm Gala.   . Fibroids    had lupron injections, pt to have myomectomy  . GERD (gastroesophageal reflux disease)   . H. pylori infection 2013  . Lactose intolerance   . Migraines   . Salmonella                     Past Surgical History:  Procedure Laterality Date  . COLONOSCOPY W/ BIOPSIES  2017  . wisdom teeth extracted  2008, 2011             Family History  Problem Relation Age of Onset  . Colon cancer Maternal Aunt   . Esophageal cancer Neg Hx   . Rectal cancer Neg Hx   . Stomach cancer Neg Hx   . Alcohol abuse Maternal Grandfather   . Diabetes Paternal Grandmother   . Alcohol abuse Maternal Uncle    No hereditary disease.  No cancer of breast, ovary, uterus. No cutaneous leiomyomatosis or renal cell carcinoma.  Social History   Social History  . Marital status: Single    Spouse name: N/A  . Number of children: N/A  . Years of education: N/A    Occupational History  . Not on file.   Social History Main Topics  . Smoking status: Never Smoker  . Smokeless tobacco: Never Used  . Alcohol use 0.6 oz/week    1 Glasses of wine per week  . Drug use: No  . Sexual activity: No     Comment: No sex past 4-5 months   Other Topics Concern  . Not on file   Social History Narrative   Marital status: single; not dating.      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       Lives: with parents.       Tobacco; none      Alcohol: socially      Drugs: none      Exercise: none   Drinks caffeine   Wears her seatbelt.   Does not eat red meat or dairy products.    Smoke detector in the home   Feels safe in her relationships.        Allergies  Allergen Reactions  . Prozac [Fluoxetine Hcl] Other (See Comments)    Suicidal thoughts  . Ibuprofen Other (See Comments)  Causes ulcers    No current facility-administered medications on file prior to encounter.    Current Outpatient Prescriptions on File Prior to Encounter  Medication Sig Dispense Refill  . clindamycin (CLEOCIN T) 1 % external solution Apply 1 application topically every morning.  11  . tretinoin (RETIN-A) 0.05 % cream APPLY TO FACE NIGHTLY TO TOLERANCE.  2  . omeprazole (PRILOSEC) 40 MG capsule Take 1 capsule (40 mg total) by mouth daily. (Patient not taking: Reported on 03/10/2016) 30 capsule 3  . ondansetron (ZOFRAN) 4 MG tablet Take 1 tablet (4 mg total) by mouth every 8 (eight) hours as needed for nausea or vomiting. (Patient not taking: Reported on 03/10/2016) 20 tablet 0  . polyethylene glycol (MIRALAX / GLYCOLAX) packet Take 17 g by mouth daily. (Patient not taking: Reported on 03/10/2016) 14 each 0     Review of Systems  Constitutional: Negative.   HENT: Negative.   Eyes: Negative.   Respiratory: Negative.   Cardiovascular: Negative.   Gastrointestinal: Negative.    Genitourinary: Negative.   Musculoskeletal: Negative.   Skin: Negative.   Neurological: Negative.   Endo/Heme/Allergies: Negative.   Psychiatric/Behavioral: Negative.      Physical Exam  There were no vitals taken for this visit. Constitutional: She is oriented to person, place, and time. She appears well-developed and well-nourished.  HENT:  Head: Normocephalic and atraumatic.  Nose: Nose normal.  Mouth/Throat: Oropharynx is clear and moist. No oropharyngeal exudate.  Eyes: Conjunctivae normal and EOM are normal. Pupils are equal, round, and reactive to light. No scleral icterus.  Neck: Normal range of motion. Neck supple. No tracheal deviation present. No thyromegaly present.  Cardiovascular: Normal rate.   Respiratory: Effort normal and breath sounds normal.  GI: Soft. Bowel sounds are normal. She exhibits no distension and no mass. There is no tenderness.  Lymphadenopathy:    She has no cervical adenopathy.  Neurological: She is alert and oriented to person, place, and time. She has normal reflexes.  Skin: Skin is warm.  Psychiatric: She has a normal mood and affect. Her behavior is normal. Judgment and thought content normal.       Assessment/Plan:  I agree with the recommendation to perform a myomectomy.  I discussed potential effects of large myomas on future reproduction.  Although most fibroids do not cause infertility, they can be associated with increased pregnancy complications such as preterm birth, fibroid degeneration and preterm contractions, as well as pregnancy loss. I discussed methods of myomectomy including alternatives to myomectomy.  I would recommend GelPort-assisted laparoscopic myomectomy, given the current size and fundal posterior location of the myoma.  I told the patient that this is associated with the fastest recovery time. I reviewed with the patient and her parents were present at the consultation the pros and cons as well as future need for  cesarean section after myomectomy. Bowel prep instructions were given.  All of patient's questions were answered.  She verbalized understanding and that it is recommended she does not conceive for 2-3 months for uterus to heal.

## 2016-03-13 ENCOUNTER — Ambulatory Visit (INDEPENDENT_AMBULATORY_CARE_PROVIDER_SITE_OTHER): Payer: BC Managed Care – PPO | Admitting: Family Medicine

## 2016-03-13 ENCOUNTER — Encounter: Payer: Self-pay | Admitting: Family Medicine

## 2016-03-13 ENCOUNTER — Other Ambulatory Visit (HOSPITAL_COMMUNITY)
Admit: 2016-03-13 | Discharge: 2016-03-13 | Disposition: A | Discharge status: Home / Self Care | Payer: BC Managed Care – PPO | Attending: *Deleted | Admitting: *Deleted

## 2016-03-13 ENCOUNTER — Telehealth: Payer: Self-pay | Admitting: Family Medicine

## 2016-03-13 ENCOUNTER — Ambulatory Visit: Payer: BC Managed Care – PPO | Admitting: Family Medicine

## 2016-03-13 ENCOUNTER — Ambulatory Visit (HOSPITAL_BASED_OUTPATIENT_CLINIC_OR_DEPARTMENT_OTHER)
Admission: RE | Admit: 2016-03-13 | Discharge: 2016-03-13 | Disposition: A | Discharge status: Home / Self Care | Payer: BC Managed Care – PPO | Source: Ambulatory Visit | Attending: Family Medicine | Admitting: Family Medicine

## 2016-03-13 VITALS — BP 116/78 | HR 103 | Temp 98.6°F | Resp 20 | Wt 116.8 lb

## 2016-03-13 DIAGNOSIS — M545 Low back pain, unspecified: Secondary | ICD-10-CM

## 2016-03-13 DIAGNOSIS — R1030 Lower abdominal pain, unspecified: Secondary | ICD-10-CM | POA: Diagnosis not present

## 2016-03-13 LAB — URINALYSIS, ROUTINE W REFLEX MICROSCOPIC
Glucose, UA: NEGATIVE mg/dL
HGB URINE DIPSTICK: NEGATIVE
KETONES UR: NEGATIVE mg/dL
Leukocytes, UA: NEGATIVE
NITRITE: NEGATIVE
Protein, ur: NEGATIVE mg/dL
SPECIFIC GRAVITY, URINE: 1.03 (ref 1.005–1.030)
pH: 6 (ref 5.0–8.0)

## 2016-03-13 LAB — POC URINALSYSI DIPSTICK (AUTOMATED)
Blood, UA: NEGATIVE
GLUCOSE UA: NEGATIVE
Ketones, UA: NEGATIVE
LEUKOCYTES UA: NEGATIVE
NITRITE UA: NEGATIVE
PH UA: 6
Spec Grav, UA: 1.025
UROBILINOGEN UA: 1

## 2016-03-13 MED ORDER — CYCLOBENZAPRINE HCL 5 MG PO TABS: 5.0000 mg | ORAL_TABLET | Freq: Two times a day (BID) | ORAL | 0 refills | Status: DC | PRN

## 2016-03-13 NOTE — Patient Instructions (Signed)
This may be a muscle strain. I would like you to get an xray completed just to make sure its not a kidney stone.   I have prescribed a muscle relaxer and I want you to use aleve or advil following bottle/label instructions.  Continue to use heating pad and rest over the weekend.   We will call you as soon as we get the results.

## 2016-03-13 NOTE — Progress Notes (Signed)
Patient ID: Grace Campbell, female   DOB: [birth year], 25 y.o.   MRN: [id number]      Patient ID: Grace Campbell, female  DOB: 30-Jun-1991, 25 y.o.   MRN: [id number] Patient Care Team    Relationship Specialty Notifications Start End  Ma Hillock, DO PCP - General Family Medicine  01/21/16   Thurnell Lose, MD Consulting Physician Obstetrics and Gynecology  01/21/16   Milus Banister, MD Attending Physician Gastroenterology  01/21/16     Subjective:  Grace Campbell is a 25 y.o.  female present for back pain, which started 2 days ago. Pt denies increase in activity, lifting, bending, trauma, fever, chills, nausea, vomit, diarrhea or constipation.  She does not have a h/o kidney stones. She does have a h/o large fibroids and amenorrhea since 05/2015 from gynecological issues. She has a h/o of IBS and constipation. She states the day before her back pain she was having urinary frequency and cramping in her lower abdomen. She denies dysuria, vaginal discharge or lesions. She has a decreased appetite, but tolerating PO. She has had a BM everyday, but only small amounts. She has tried to take aleve for the pain, which helped some. Flexion of the back makes her lumbar pain better. She has not had radiation of pain to extremities. She was unable to sleep last secondary to discomfort.   Past Medical History:  Diagnosis Date  . Allergy   . Anemia   . Anxiety   . Arthritis   . Asthma   . Chicken pox   . Colitis   . Depression    has been followed by psych in the past, Dr. Rolm Gala.   . Fibroids    had lupron injections, pt to have myomectomy  . GERD (gastroesophageal reflux disease)   . H. pylori infection 2013  . Lactose intolerance   . Migraines   . Salmonella    Allergies  Allergen Reactions  . Prozac [Fluoxetine Hcl] Other (See Comments)    Suicidal thoughts  . Ibuprofen Other (See Comments)    Causes ulcers   Past Surgical History:  Procedure Laterality Date  . COLONOSCOPY W/  BIOPSIES  2017  . wisdom teeth extracted  2008, 2011   Family History  Problem Relation Age of Onset  . Colon cancer Maternal Aunt   . Alcohol abuse Maternal Grandfather   . Diabetes Paternal Grandmother   . Alcohol abuse Maternal Uncle   . Esophageal cancer Neg Hx   . Rectal cancer Neg Hx   . Stomach cancer Neg Hx    Social History   Social History  . Marital status: Single    Spouse name: N/A  . Number of children: N/A  . Years of education: N/A   Occupational History  . Not on file.   Social History Main Topics  . Smoking status: Never Smoker  . Smokeless tobacco: Never Used  . Alcohol use 0.6 oz/week    1 Glasses of wine per week  . Drug use: No  . Sexual activity: No     Comment: No sex past 4-5 months   Other Topics Concern  . Not on file   Social History Narrative   Marital status: single; not dating.      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       Lives: with parents.       Tobacco;  none      Alcohol: socially      Drugs: none      Exercise: none   Drinks caffeine   Wears her seatbelt.   Does not eat red meat or dairy products.    Smoke detector in the home   Feels safe in her relationships.         Medication List       Accurate as of 03/13/16 10:43 AM. Always use your most recent med list.          clindamycin 1 % external solution Commonly known as:  CLEOCIN T Apply 1 application topically every morning.   doxycycline 100 MG capsule Commonly known as:  VIBRAMYCIN TAKE ONE CAPSULE BY MOUTH TWICE DAILY WITH FOOD   omeprazole 40 MG capsule Commonly known as:  PRILOSEC Take 1 capsule (40 mg total) by mouth daily.   ondansetron 4 MG tablet Commonly known as:  ZOFRAN Take 1 tablet (4 mg total) by mouth every 8 (eight) hours as needed for nausea or vomiting.   polyethylene glycol packet Commonly known as:  MIRALAX / GLYCOLAX Take 17 g by mouth  daily.   tretinoin 0.05 % cream Commonly known as:  RETIN-A APPLY TO FACE NIGHTLY TO TOLERANCE.        Recent Results (from the past 2160 hour(s))  TSH     Status: None   Collection Time: 01/21/16  2:31 PM  Result Value Ref Range   TSH 0.73 mIU/L    Comment:   Reference Range   > or = 20 Years  0.40-4.50   Pregnancy Range First trimester  0.26-2.66 Second trimester 0.55-2.73 Third trimester  0.43-2.91     CBC w/Diff     Status: Abnormal   Collection Time: 01/21/16  2:31 PM  Result Value Ref Range   WBC 3.8 3.8 - 10.8 K/uL   RBC 4.18 3.80 - 5.10 MIL/uL   Hemoglobin 13.0 11.7 - 15.5 g/dL   HCT 38.2 35.0 - 45.0 %   MCV 91.4 80.0 - 100.0 fL   MCH 31.1 27.0 - 33.0 pg   MCHC 34.0 32.0 - 36.0 g/dL   RDW 13.6 11.0 - 15.0 %   Platelets 295 140 - 400 K/uL   MPV 9.2 7.5 - 12.5 fL   Neutro Abs 1,216 (L) 1,500 - 7,800 cells/uL   Lymphs Abs 2,242 850 - 3,900 cells/uL   Monocytes Absolute 266 200 - 950 cells/uL   Eosinophils Absolute 38 15 - 500 cells/uL   Basophils Absolute 38 0 - 200 cells/uL   Neutrophils Relative % 32 %   Lymphocytes Relative 59 %   Monocytes Relative 7 %   Eosinophils Relative 1 %   Basophils Relative 1 %   Smear Review Criteria for review not met     Comment: ** Please note change in unit of measure and reference range(s). **  Sed Rate (ESR)     Status: None   Collection Time: 01/21/16  2:31 PM  Result Value Ref Range   Sed Rate 4 0 - 20 mm/hr  Comp Met (CMET)     Status: Abnormal   Collection Time: 01/21/16  2:31 PM  Result Value Ref Range   Sodium 139 135 - 146 mmol/L   Potassium 4.0 3.5 - 5.3 mmol/L   Chloride 103 98 - 110 mmol/L   CO2 24 20 - 31 mmol/L   Glucose, Bld 85 65 - 99 mg/dL   BUN 10 7 - 25 mg/dL  Creat 0.86 0.50 - 1.10 mg/dL   Total Bilirubin 2.2 (H) 0.2 - 1.2 mg/dL   Alkaline Phosphatase 40 33 - 115 U/L   AST 19 10 - 30 U/L   ALT 15 6 - 29 U/L   Total Protein 7.2 6.1 - 8.1 g/dL   Albumin 4.6 3.6 - 5.1 g/dL   Calcium 9.4  8.6 - 10.2 mg/dL  C-reactive protein     Status: None   Collection Time: 01/21/16  2:31 PM  Result Value Ref Range   CRP <0.5 <0.60 mg/dL  POCT urine pregnancy     Status: None   Collection Time: 01/21/16  2:45 PM  Result Value Ref Range   Preg Test, Ur Negative Negative  Hemoccult Cards (X3 cards)     Status: None   Collection Time: 01/29/16  2:44 PM  Result Value Ref Range   OCCULT 1 Negative Negative   OCCULT 2 Negative Negative   OCCULT 3 Negative Negative   OCCULT 4 Negative Negative   OCCULT 5 Negative Negative   Fecal Occult Blood Negative Negative    Dg Abd 1 View  06/12/2015  CLINICAL DATA:  Abdominal pain. EXAM: ABDOMEN - 1 VIEW COMPARISON:  06/15/2009 . FINDINGS: Soft tissue structures are unremarkable. Several air-filled loops of slightly prominent small bowel are noted. There is mild small bowel wall fold thickening. Active small-bowel pathology including enteritis could present in this fashion. Continued follow-up exam suggested to demonstrate clearing and to exclude developing bowel obstruction. Colonic gas pattern is unremarkable. No free air. No acute bony abnormality. Pelvic calcifications consistent phleboliths . IMPRESSION: Several air-filled loops of slightly prominent small bowel are noted. There is mild small bowel wall fold thickening. Active small bowel pathology including enteritis could present in this fashion. Continued follow-up exam suggested to demonstrate clearing and to exclude developing bowel obstruction. Electronically Signed   By: Marcello Moores  Register   On: 06/12/2015 10:14   Ct Abdomen Pelvis W Contrast  06/12/2015  CLINICAL DATA:  Bilateral lower abdominal pain, nausea and vomiting for 1 day. EXAM: CT ABDOMEN AND PELVIS WITH CONTRAST TECHNIQUE: Multidetector CT imaging of the abdomen and pelvis was performed using the standard protocol following bolus administration of intravenous contrast. CONTRAST:  130m OMNIPAQUE IOHEXOL 300 MG/ML  SOLN COMPARISON:   None. FINDINGS: Lower chest: The lung bases are clear. No pleural effusion or pulmonary nodule. The heart is normal in size. No pericardial effusion. The distal esophagus is grossly normal. Hepatobiliary: No focal hepatic lesions or intrahepatic biliary dilatation. The gallbladder is normal. No common bile duct dilatation. Pancreas: No mass, inflammation or ductal dilatation. Spleen: Normal size.  No focal lesions. Adrenals/Urinary Tract: The adrenal glands and kidneys are normal. Stomach/Bowel: The stomach, duodenum, and small bowel are unremarkable. No inflammatory changes, mass lesions or obstructive findings. Severe inflammatory or infectious process involving the upper aspect of the ascending colon, the transverse colon and the upper aspect of the descending colon. The sigmoid colon rectum appear normal. The terminal ileum is normal. The appendix is normal. Vascular/Lymphatic: No mesenteric or retroperitoneal mass or adenopathy. Scattered lymph nodes are noted. Reproductive: There is a large uterine fibroid measuring 8 x 5.5 cm. The ovaries are normal. Other: No pelvic mass or adenopathy. Small amount of free pelvic fluid. No abdominal wall hernia or subcutaneous lesions. Musculoskeletal: No significant bony findings. IMPRESSION: 1. Severe inflammatory or infectious colitis. This is mainly involving the transverse colon but also the adjacent aspects of the ascending and descending colon. 2. Scattered  mesenteric and retroperitoneal lymph nodes but no mass or adenopathy. 3. Large fibroid involving the posterior myometrium. Electronically Signed   By: Marijo Sanes M.D.   On: 06/12/2015 16:31     ROS: 14 pt review of systems performed and negative (unless mentioned in an HPI)  Objective: BP 116/78 (BP Location: Left Arm, Patient Position: Sitting, Cuff Size: Normal)   Pulse (!) 103   Temp 98.6 F (37 C)   Resp 20   Wt 116 lb 12.8 oz (53 kg)   SpO2 98%   BMI 18.29 kg/m  Gen: Afebrile. No acute  distress. Nontoxic in appearance, well-developed, well-nourished, thin, AAF.  HENT: AT. [place].MMM  Eyes:Pupils Equal Round Reactive to light, Extraocular movements intact,  Conjunctiva without redness, discharge or icterus. Neck/lymp/endocrine: Supple,no  lymphadenopathy CV: RRR no murmur, no edema, +2/4 P posterior tibialis pulses. Chest: CTAB, no wheeze, rhonchi or crackles.  Abd: Soft.thin. NDNT, mild pressure suprapubic. BS present.. No hepatosplenomegaly. No rebound tenderness or guarding.  MSK: No erythema, no bruising, skin intact, No TTP spine/bone or MSK. No CVA tenderness bilateral. Discomfort with rotation bilateral. NROM.  Skin:  Warm and well-perfused. Skin intact. Neuro: Normal gait. PERLA. EOMi. Alert. Oriented x3.    Results for orders placed or performed in visit on 03/13/16 (from the past 24 hour(s))  POCT Urinalysis Dipstick (Automated)     Status: None   Collection Time: 03/13/16 10:54 AM  Result Value Ref Range   Color, UA dark yellow    Clarity, UA clear    Glucose, UA negative    Bilirubin, UA small    Ketones, UA negative    Spec Grav, UA 1.025    Blood, UA negative    pH, UA 6.0    Protein, UA 29m    Urobilinogen, UA 1.0    Nitrite, UA negative    Leukocytes, UA Negative Negative     No results found for this or any previous visit (from the past 24 hour(s)). Assessment/plan: SKIAJA SHORTYis a 25y.o. female present for establishment of care and back pain Bilateral low back pain without sciatica - urine POCT did not suggest UTI, uncertain if msk related. Pt did not appear ill, mildly uncomfortable. ? Pain from potential cycle/fibroid/gyn vs muscle strain vs kidney stone? - Discussion with patient nothing by her exam is pointing to a clear cause, therefore suggest muscle relaxer (sedation warning provided), heat pad, NSAIDS and rest advised. Will obtain KUB and lumbar xray considering no clear cause or trauma. Will treat as strain. If normal imaging,  stretching starting Monday, using pain as her guide.  - POCT Urinalysis Dipstick (Automated) - Urinalysis, [facility] cyclobenzaprine (FLEXERIL) 5 MG tablet; Take 1 tablet (5 mg total) by mouth 2 (two) times daily as needed for muscle spasms.  Dispense: 30 tablet; Refill: 0 - DG Abd 1 View; Future - DG Lumbar Spine Complete; Future - F/U depending on images.   Electronically signed by: RHoward Pouch DO LDickson

## 2016-03-13 NOTE — Telephone Encounter (Signed)
Please call pt: - her xrays are normal. Rest, hydrate, take muscle relaxer/nsaids as discussed and start light stretches of back on Monday. If she asks: advise her to go through the movements we did on exam, stretching those muscle groups.  - If pain not improved in 1-2 weeks, or worsening she should be seen.

## 2016-03-13 NOTE — Telephone Encounter (Signed)
Left patient detailed message with xray results and instructions on voice mail per [facility]

## 2016-03-17 NOTE — Patient Instructions (Signed)
Your procedure is scheduled on:  Wednesday, Sept. 20, 2017  Enter through the [employer] [facility] at:  9:15 AM  Pick up the phone at the desk and dial [phone].  Call this number if you have problems the morning of surgery: [phone].  Remember: Do NOT eat food or drink after:  Midnight Tuesday  Take these medicines the morning of surgery with a SIP OF WATER:  None  Do NOT wear jewelry (body piercing), metal hair clips/bobby pins, make-up, or nail polish. Do NOT wear lotions, powders, or perfumes.  You may wear deodorant. Do NOT shave for 48 hours prior to surgery. Do NOT bring valuables to the hospital. Contacts, dentures, or bridgework may not be worn into surgery.  Leave suitcase in car.  After surgery it may be brought to your room.  For patients admitted to the hospital, checkout time is 11:00 AM the day of discharge.  Have a responsible adult drive you home and stay with you for 24 hours after your procedure

## 2016-03-18 ENCOUNTER — Encounter (HOSPITAL_COMMUNITY): Payer: Self-pay

## 2016-03-18 ENCOUNTER — Encounter (HOSPITAL_COMMUNITY)
Admission: RE | Admit: 2016-03-18 | Discharge: 2016-03-18 | Disposition: A | Discharge status: Home / Self Care | Payer: BC Managed Care – PPO | Source: Ambulatory Visit | Attending: Obstetrics and Gynecology | Admitting: Obstetrics and Gynecology

## 2016-03-18 DIAGNOSIS — Z01812 Encounter for preprocedural laboratory examination: Secondary | ICD-10-CM | POA: Diagnosis present

## 2016-03-18 DIAGNOSIS — K219 Gastro-esophageal reflux disease without esophagitis: Secondary | ICD-10-CM | POA: Diagnosis not present

## 2016-03-18 DIAGNOSIS — D251 Intramural leiomyoma of uterus: Secondary | ICD-10-CM | POA: Insufficient documentation

## 2016-03-18 DIAGNOSIS — N920 Excessive and frequent menstruation with regular cycle: Secondary | ICD-10-CM | POA: Insufficient documentation

## 2016-03-18 DIAGNOSIS — Z79899 Other long term (current) drug therapy: Secondary | ICD-10-CM | POA: Diagnosis not present

## 2016-03-18 LAB — CBC
HEMATOCRIT: 32.1 % — AB (ref 36.0–46.0)
HEMOGLOBIN: 11.1 g/dL — AB (ref 12.0–15.0)
MCH: 31.1 pg (ref 26.0–34.0)
MCHC: 34.6 g/dL (ref 30.0–36.0)
MCV: 89.9 fL (ref 78.0–100.0)
Platelets: 234 10*3/uL (ref 150–400)
RBC: 3.57 MIL/uL — ABNORMAL LOW (ref 3.87–5.11)
RDW: 13.2 % (ref 11.5–15.5)
WBC: 3.8 10*3/uL — AB (ref 4.0–10.5)

## 2016-03-18 LAB — TYPE AND SCREEN
ABO/RH(D): A POS
ANTIBODY SCREEN: NEGATIVE

## 2016-03-25 ENCOUNTER — Encounter (HOSPITAL_COMMUNITY): Payer: Self-pay

## 2016-03-25 ENCOUNTER — Ambulatory Visit (HOSPITAL_COMMUNITY)
Admission: RE | Admit: 2016-03-25 | Discharge: 2016-03-25 | Disposition: A | Discharge status: Home / Self Care | Payer: BC Managed Care – PPO | Source: Ambulatory Visit | Attending: Obstetrics and Gynecology | Admitting: Obstetrics and Gynecology

## 2016-03-25 ENCOUNTER — Encounter (HOSPITAL_COMMUNITY)
Admission: RE | Disposition: A | Discharge status: Home / Self Care | Payer: Self-pay | Source: Ambulatory Visit | Attending: Obstetrics and Gynecology

## 2016-03-25 ENCOUNTER — Ambulatory Visit (HOSPITAL_COMMUNITY): Payer: BC Managed Care – PPO | Admitting: Anesthesiology

## 2016-03-25 DIAGNOSIS — N920 Excessive and frequent menstruation with regular cycle: Secondary | ICD-10-CM | POA: Insufficient documentation

## 2016-03-25 DIAGNOSIS — N803 Endometriosis of pelvic peritoneum: Secondary | ICD-10-CM | POA: Insufficient documentation

## 2016-03-25 DIAGNOSIS — R102 Pelvic and perineal pain: Secondary | ICD-10-CM | POA: Diagnosis not present

## 2016-03-25 DIAGNOSIS — D259 Leiomyoma of uterus, unspecified: Secondary | ICD-10-CM | POA: Diagnosis present

## 2016-03-25 DIAGNOSIS — K219 Gastro-esophageal reflux disease without esophagitis: Secondary | ICD-10-CM | POA: Diagnosis not present

## 2016-03-25 HISTORY — PX: LAPAROSCOPIC GELPORT ASSISTED MYOMECTOMY: SHX6549

## 2016-03-25 HISTORY — PX: ABLATION ON ENDOMETRIOSIS: SHX5787

## 2016-03-25 LAB — PREGNANCY, URINE: PREG TEST UR: NEGATIVE

## 2016-03-25 SURGERY — LAPAROSCOPIC GELPORT ASSISTED MYOMECTOMY
Anesthesia: General | Site: Abdomen

## 2016-03-25 MED ORDER — ACETAMINOPHEN 10 MG/ML IV SOLN
1000.0000 mg | Freq: Once | INTRAVENOUS | Status: AC
  Administered 2016-03-25: 1000 mg via INTRAVENOUS
  Filled 2016-03-25: qty 100

## 2016-03-25 MED ORDER — HEPARIN SODIUM (PORCINE) 5000 UNIT/ML IJ SOLN
INTRAMUSCULAR | Status: DC | PRN
  Administered 2016-03-25: 15000 [IU] via SUBCUTANEOUS

## 2016-03-25 MED ORDER — HYDROMORPHONE HCL 1 MG/ML IJ SOLN
INTRAMUSCULAR | Status: AC
  Filled 2016-03-25: qty 1

## 2016-03-25 MED ORDER — NEOSTIGMINE METHYLSULFATE 10 MG/10ML IV SOLN
INTRAVENOUS | Status: DC | PRN
  Administered 2016-03-25: 3 mg via INTRAVENOUS

## 2016-03-25 MED ORDER — LIDOCAINE HCL (CARDIAC) 20 MG/ML IV SOLN
INTRAVENOUS | Status: AC
  Filled 2016-03-25: qty 5

## 2016-03-25 MED ORDER — LIDOCAINE HCL (CARDIAC) 20 MG/ML IV SOLN
INTRAVENOUS | Status: DC | PRN
  Administered 2016-03-25: 50 mg via INTRAVENOUS

## 2016-03-25 MED ORDER — KETOROLAC TROMETHAMINE 30 MG/ML IJ SOLN
INTRAMUSCULAR | Status: AC
  Filled 2016-03-25: qty 1

## 2016-03-25 MED ORDER — LACTATED RINGERS IR SOLN
Status: DC | PRN
  Administered 2016-03-25: 3000 mL

## 2016-03-25 MED ORDER — ROCURONIUM BROMIDE 100 MG/10ML IV SOLN
INTRAVENOUS | Status: DC | PRN
  Administered 2016-03-25: 40 mg via INTRAVENOUS

## 2016-03-25 MED ORDER — DEXAMETHASONE SODIUM PHOSPHATE 10 MG/ML IJ SOLN
INTRAMUSCULAR | Status: DC | PRN
  Administered 2016-03-25: 4 mg via INTRAVENOUS

## 2016-03-25 MED ORDER — FENTANYL CITRATE (PF) 100 MCG/2ML IJ SOLN
25.0000 ug | INTRAMUSCULAR | Status: DC | PRN
  Administered 2016-03-25 (×4): 25 ug via INTRAVENOUS

## 2016-03-25 MED ORDER — ROCURONIUM BROMIDE 100 MG/10ML IV SOLN
INTRAVENOUS | Status: AC
  Filled 2016-03-25: qty 1

## 2016-03-25 MED ORDER — PROPOFOL 10 MG/ML IV BOLUS
INTRAVENOUS | Status: AC
  Filled 2016-03-25: qty 20

## 2016-03-25 MED ORDER — BUPIVACAINE-EPINEPHRINE 0.25% -1:200000 IJ SOLN
INTRAMUSCULAR | Status: DC | PRN
  Administered 2016-03-25: 10 mL
  Administered 2016-03-25: 7 mL

## 2016-03-25 MED ORDER — PROPOFOL 10 MG/ML IV BOLUS
INTRAVENOUS | Status: DC | PRN
  Administered 2016-03-25: 120 mg via INTRAVENOUS
  Administered 2016-03-25: 30 mg via INTRAVENOUS

## 2016-03-25 MED ORDER — BUPIVACAINE-EPINEPHRINE (PF) 0.25% -1:200000 IJ SOLN
INTRAMUSCULAR | Status: AC
  Filled 2016-03-25: qty 30

## 2016-03-25 MED ORDER — OXYCODONE HCL 5 MG/5ML PO SOLN: 5.0000 mg | Freq: Once | ORAL | Status: AC | PRN

## 2016-03-25 MED ORDER — FENTANYL CITRATE (PF) 100 MCG/2ML IJ SOLN
INTRAMUSCULAR | Status: AC
  Filled 2016-03-25: qty 2

## 2016-03-25 MED ORDER — SODIUM CHLORIDE 0.9 % IJ SOLN
INTRAMUSCULAR | Status: AC
  Filled 2016-03-25: qty 50

## 2016-03-25 MED ORDER — OXYCODONE HCL 5 MG PO TABS
ORAL_TABLET | ORAL | Status: AC
  Filled 2016-03-25: qty 1

## 2016-03-25 MED ORDER — ONDANSETRON HCL 4 MG/2ML IJ SOLN
INTRAMUSCULAR | Status: DC | PRN
  Administered 2016-03-25: 4 mg via INTRAVENOUS

## 2016-03-25 MED ORDER — OXYCODONE-ACETAMINOPHEN 7.5-325 MG PO TABS: 1.0000 | ORAL_TABLET | ORAL | 0 refills | Status: DC | PRN

## 2016-03-25 MED ORDER — HEPARIN SODIUM (PORCINE) 5000 UNIT/ML IJ SOLN
INTRAMUSCULAR | Status: AC
  Filled 2016-03-25: qty 3

## 2016-03-25 MED ORDER — MIDAZOLAM HCL 2 MG/2ML IJ SOLN
INTRAMUSCULAR | Status: AC
  Filled 2016-03-25: qty 2

## 2016-03-25 MED ORDER — MIDAZOLAM HCL 2 MG/2ML IJ SOLN
INTRAMUSCULAR | Status: DC | PRN
  Administered 2016-03-25: 2 mg via INTRAVENOUS

## 2016-03-25 MED ORDER — METHYLENE BLUE 0.5 % INJ SOLN
INTRAVENOUS | Status: AC
  Filled 2016-03-25: qty 10

## 2016-03-25 MED ORDER — FENTANYL CITRATE (PF) 100 MCG/2ML IJ SOLN
INTRAMUSCULAR | Status: DC | PRN
  Administered 2016-03-25 (×4): 50 ug via INTRAVENOUS

## 2016-03-25 MED ORDER — LACTATED RINGERS IV SOLN
INTRAVENOUS | Status: DC
  Administered 2016-03-25 (×3): via INTRAVENOUS

## 2016-03-25 MED ORDER — ONDANSETRON HCL 4 MG/2ML IJ SOLN
INTRAMUSCULAR | Status: AC
  Filled 2016-03-25: qty 2

## 2016-03-25 MED ORDER — VASOPRESSIN 20 UNIT/ML IV SOLN
INTRAVENOUS | Status: AC
  Filled 2016-03-25: qty 1

## 2016-03-25 MED ORDER — FENTANYL CITRATE (PF) 100 MCG/2ML IJ SOLN
INTRAMUSCULAR | Status: AC
  Filled 2016-03-25: qty 4

## 2016-03-25 MED ORDER — ONDANSETRON HCL 4 MG/2ML IJ SOLN: 4.0000 mg | Freq: Once | INTRAMUSCULAR | Status: DC | PRN

## 2016-03-25 MED ORDER — VASOPRESSIN 20 UNIT/ML IV SOLN
INTRAVENOUS | Status: DC | PRN
  Administered 2016-03-25: 20 mL via INTRAMUSCULAR

## 2016-03-25 MED ORDER — OXYCODONE HCL 5 MG PO TABS
5.0000 mg | ORAL_TABLET | Freq: Once | ORAL | Status: AC | PRN
  Administered 2016-03-25: 5 mg via ORAL

## 2016-03-25 MED ORDER — HYDROMORPHONE HCL 1 MG/ML IJ SOLN
INTRAMUSCULAR | Status: DC | PRN
  Administered 2016-03-25: 0.5 mg via INTRAVENOUS

## 2016-03-25 MED ORDER — DEXAMETHASONE SODIUM PHOSPHATE 4 MG/ML IJ SOLN
INTRAMUSCULAR | Status: AC
  Filled 2016-03-25: qty 1

## 2016-03-25 MED ORDER — CEFAZOLIN SODIUM-DEXTROSE 2-4 GM/100ML-% IV SOLN
2.0000 g | INTRAVENOUS | Status: AC
  Administered 2016-03-25: 2 g via INTRAVENOUS

## 2016-03-25 MED ORDER — METHYLENE BLUE 0.5 % INJ SOLN
INTRAVENOUS | Status: DC | PRN
  Administered 2016-03-25: 5 mL

## 2016-03-25 MED ORDER — GLYCOPYRROLATE 0.2 MG/ML IJ SOLN
INTRAMUSCULAR | Status: DC | PRN
  Administered 2016-03-25: 0.4 mg via INTRAVENOUS

## 2016-03-25 MED ORDER — SCOPOLAMINE 1 MG/3DAYS TD PT72
MEDICATED_PATCH | TRANSDERMAL | Status: AC
  Administered 2016-03-25: 1.5 mg via TRANSDERMAL
  Filled 2016-03-25: qty 1

## 2016-03-25 MED ORDER — GLYCOPYRROLATE 0.2 MG/ML IJ SOLN
INTRAMUSCULAR | Status: AC
  Filled 2016-03-25: qty 2

## 2016-03-25 MED ORDER — SCOPOLAMINE 1 MG/3DAYS TD PT72
1.0000 | MEDICATED_PATCH | Freq: Once | TRANSDERMAL | Status: DC
  Administered 2016-03-25: 1.5 mg via TRANSDERMAL

## 2016-03-25 SURGICAL SUPPLY — 80 items
ADAPTER CATH SYR TO TUBING 38M (ADAPTER) IMPLANT
ADPR CATH LL SYR 3/32 TPR (ADAPTER)
BAG SPEC RTRVL LRG 6X4 10 (ENDOMECHANICALS)
BLADE SURG 10 STRL SS (BLADE) ×2 IMPLANT
BLADE SURG 15 STRL LF C SS BP (BLADE) ×2 IMPLANT
BLADE SURG 15 STRL SS (BLADE) ×3
BRR ADH 6X5 SEPRAFILM 1 SHT (MISCELLANEOUS) ×4
CABLE HIGH FREQUENCY MONO STRZ (ELECTRODE) IMPLANT
CATH FOLEY 2WAY SLVR  5CC 14FR (CATHETERS) ×1
CATH FOLEY 2WAY SLVR 5CC 14FR (CATHETERS) ×2 IMPLANT
CATH ROBINSON RED A/P 16FR (CATHETERS) IMPLANT
CLOTH BEACON ORANGE TIMEOUT ST (SAFETY) ×3 IMPLANT
COVER MAYO STAND STRL (DRAPES) ×3 IMPLANT
DEVICE TROCAR PUNCTURE CLOSURE (ENDOMECHANICALS) IMPLANT
DRAPE CAMERA CLOSED 9X96 (DRAPES) ×1 IMPLANT
DRAPE UNDERBUTTOCKS STRL (DRAPE) ×1 IMPLANT
DRSG OPSITE POSTOP 3X4 (GAUZE/BANDAGES/DRESSINGS) ×2 IMPLANT
DURAPREP 26ML APPLICATOR (WOUND CARE) ×3 IMPLANT
ELECT NDL TIP 2.8 STRL (NEEDLE) IMPLANT
ELECT NEEDLE TIP 2.8 STRL (NEEDLE) IMPLANT
ELECT REM PT RETURN 9FT ADLT (ELECTROSURGICAL) ×3
ELECTRODE REM PT RTRN 9FT ADLT (ELECTROSURGICAL) ×2 IMPLANT
FILTER SMOKE EVAC LAPAROSHD (FILTER) ×3 IMPLANT
FORCEPS CUTTING 33CM 5MM (CUTTING FORCEPS) IMPLANT
GLOVE BIO SURGEON STRL SZ8 (GLOVE) ×5 IMPLANT
GLOVE BIOGEL PI IND STRL 7.0 (GLOVE) ×3 IMPLANT
GLOVE BIOGEL PI IND STRL 8.5 (GLOVE) ×2 IMPLANT
GLOVE BIOGEL PI INDICATOR 7.0 (GLOVE) ×2
GLOVE BIOGEL PI INDICATOR 8.5 (GLOVE) ×1
GOWN PREVENTION PLUS LG XLONG (DISPOSABLE) ×10 IMPLANT
GOWN STRL REUS W/TWL LRG LVL3 (GOWN DISPOSABLE) ×6 IMPLANT
LIQUID BAND (GAUZE/BANDAGES/DRESSINGS) ×2 IMPLANT
MANIPULATOR UTERINE 4.5 ZUMI (MISCELLANEOUS) ×3 IMPLANT
NDL INSUFFLATION 14GA 120MM (NEEDLE) ×1 IMPLANT
NDL SPNL 22GX7 QUINCKE BK (NEEDLE) IMPLANT
NEEDLE HYPO 22GX1.5 SAFETY (NEEDLE) ×3 IMPLANT
NEEDLE INSUFFLATION 14GA 120MM (NEEDLE) IMPLANT
NEEDLE SPNL 22GX7 QUINCKE BK (NEEDLE) ×3 IMPLANT
PACK LAPAROSCOPY BASIN (CUSTOM PROCEDURE TRAY) ×3 IMPLANT
PACK LAPAROSCOPY II (CUSTOM PROCEDURE TRAY) ×3 IMPLANT
PAD OB MATERNITY 4.3X12.25 (PERSONAL CARE ITEMS) ×3 IMPLANT
PAD TRENDELENBURG POSITION (MISCELLANEOUS) ×3 IMPLANT
PENCIL BUTTON HOLSTER BLD 10FT (ELECTRODE) ×2 IMPLANT
POUCH SPECIMEN RETRIEVAL 10MM (ENDOMECHANICALS) IMPLANT
PROTECTOR NERVE ULNAR (MISCELLANEOUS) ×6 IMPLANT
SCALPEL HARMONIC ACE (MISCELLANEOUS) IMPLANT
SEALER TISSUE G2 CVD JAW 35 (ENDOMECHANICALS) IMPLANT
SEALER TISSUE G2 CVD JAW 45CM (ENDOMECHANICALS) IMPLANT
SEPRAFILM MEMBRANE 5X6 (MISCELLANEOUS) ×4 IMPLANT
SET IRRIG TUBING LAPAROSCOPIC (IRRIGATION / IRRIGATOR) ×3 IMPLANT
SLEEVE XCEL OPT CAN 5 100 (ENDOMECHANICALS) ×5 IMPLANT
SOLUTION ANTI FOG 6CC (MISCELLANEOUS) ×3 IMPLANT
STRIP CLOSURE SKIN 1/2X4 (GAUZE/BANDAGES/DRESSINGS) ×1 IMPLANT
SUT MNCRL AB 4-0 PS2 18 (SUTURE) ×3 IMPLANT
SUT PROLENE 0 CT 1 30 (SUTURE) IMPLANT
SUT VIC AB 2-0 CT1 27 (SUTURE) ×6
SUT VIC AB 2-0 CT1 TAPERPNT 27 (SUTURE) ×4 IMPLANT
SUT VIC AB 2-0 CT2 27 (SUTURE) ×4 IMPLANT
SUT VIC AB 2-0 UR6 27 (SUTURE) IMPLANT
SUT VIC AB 4-0 SH 27 (SUTURE) ×6
SUT VIC AB 4-0 SH 27XANBCTRL (SUTURE) ×4 IMPLANT
SUT VICRYL 0 TIES 12 18 (SUTURE) IMPLANT
SYR 20CC LL (SYRINGE) IMPLANT
SYR 3ML 23GX1 SAFETY (SYRINGE) IMPLANT
SYR 50ML LL SCALE MARK (SYRINGE) ×5 IMPLANT
SYR 50ML SLIP (SYRINGE) IMPLANT
SYR 5ML LL (SYRINGE) ×1 IMPLANT
SYR CONTROL 10ML LL (SYRINGE) ×1 IMPLANT
SYR TOOMEY 50ML (SYRINGE) IMPLANT
SYRINGE 10CC LL (SYRINGE) ×1 IMPLANT
SYS LAPSCP GELPORT 120MM (MISCELLANEOUS) ×3
SYSTEM LAPSCP GELPORT 120MM (MISCELLANEOUS) ×1 IMPLANT
TOWEL OR 17X24 6PK STRL BLUE (TOWEL DISPOSABLE) ×6 IMPLANT
TRAY FOLEY CATH SILVER 14FR (SET/KITS/TRAYS/PACK) IMPLANT
TROCAR OPTI TIP 5M 100M (ENDOMECHANICALS) ×1 IMPLANT
TROCAR XCEL DIL TIP R 11M (ENDOMECHANICALS) IMPLANT
TROCAR XCEL NON-BLD 5MMX100MML (ENDOMECHANICALS) ×3 IMPLANT
TUBING INSUFFLATION 10FT LAP (TUBING) ×1 IMPLANT
WARMER LAPAROSCOPE (MISCELLANEOUS) ×3 IMPLANT
WATER STERILE IRR 1000ML POUR (IV SOLUTION) ×3 IMPLANT

## 2016-03-25 NOTE — H&P (Signed)
History and Physical Interval Note:  03/25/2016 9:35 AM  Grace Campbell  has presented today for surgery, with the diagnosis of uterine fibroids  The various methods of treatment have been discussed with the patient and family. After consideration of risks, benefits and other options for treatment, the patient has consented to  Procedure(s): [place] (N/A) LAPAROSCOPY DIAGNOSTIC (N/A) as a surgical intervention .  The patient's history has been reviewed, patient examined, no change in status, stable for surgery.  I have reviewed the patient's chart and labs.  Questions were answered to the patient's satisfaction.     Governor Specking

## 2016-03-25 NOTE — Op Note (Signed)
OPERATIVE NOTE   Preoperative diagnosis:  Uterine myoma, pelvic pain and menorrhagia  Postoperative diagnosis: Uterine myomas, pelvic pain and menorrhagia, stage I endometriosis of pelvic peritoneum  Procedure: Laparoscopy, Gelport assisted myomectomy, excision and ablation of peritoneal lesions of endometriosis  Anesthesia: Gen. Endotracheal   Surgeon: Governor Specking   Assistant: RNFA  Complications: None   Estimated blood loss: 50 ml  Specimens: Myoma specimens, peritoneal excisional biopsies to pathology.   Findings: On exam under anesthesia, external genitalia, Bartholin's, Skene's, and urethra were normal. Vagina was normal. The cervix appeared grossly normal. The uterus sounded to 10-1/2 cm. The uterine corpus was 10-11 gestational weeks size, firm, posterior myomatous nodule, and mobile. There were no adnexal masses. Posterior fornix had no nodularity or induration.   On laparoscopy, liver edge, gallbladder and diaphragm surfaces were normal. The appendix looked grossly normal. The uterus containing the left posterior  5 cm degenerating myoma that was intramural. Both fallopian tubes appeared normal proximally and distally with 5 other 5 fimbria. Both ovaries appeared normal. There were red polypoid lesions of endometriosis in the posterior cul-de-sac. There were also stellate lesions of fibrosis around him. In addition there were red polypoid lesions of endometriosis lateral to the right uterosacral ligaments. All of these lesions were excised and ablated with needle tip electrode.  Description of the procedure: Patient was placed in lithotomy position and general endotracheal anesthesia was given. 2 g of cefazolin weregiven intravenously for prophylaxis. She was prepped and draped in sterile manner. A Foley catheter was inserted into the bladder appeared . A ZUMI uterine manipulator was inserted into the uterus for uterine manipulation and chromotubation. The uterus sounded to   10.5 cm.   An operative field was created on the abdomen and the surgeon was regloved. After preemptive anesthesia with quarter percent bupivacaine and 1:200,000 epinephrine, and intraumbilical skin incision was made. A Verress needle was inserted and pneumoperitoneum was created with carbon dioxide.  5 mm trocar was placed and video laparoscopy was started with 5 mm 30 laparoscope. 2 5 mm lower quadrant incisions were made and corresponding trochars were placed under direct visualization. Above findings were noted with the patient in Trendelenburg position. A dilute vasopressin solution (0.33 units per milliliter) was injected transcutaneously into the myometrium of the uterus. using 51 W of cutting current on needle electrode, myometrial incision was made on the posterior left lateral myoma.   at this point we decided to make a 3 cm suprapubic transverse incision about 2-1/2 cm above the symphysis pubis to place the gel port. After dissection of the anatomic layers. Nasal cavity was entered. The gel port was placed. The rest of the procedure was performed using this gel port as a Lepper scopic port at times and using it as a minilaparotomy site at other times the rest of the myoma dissection was carried out and the myoma was in situ morcellated in order to remove it through the small incision. The myometrial defect resulting from the myomectomy was closed in 3 layers: The first 2 layers were 2-0 Vicryl continuous suture on this deep myometrium and the superficial myometrium, and the third layer was a 4-0 Vicryl suture on the serosa. Hemostasis was obtained.  We then used the needle electrode with 6 W of cutting current to excise the posterior cul-de-sac peritoneal lesion cluster and this was submitted to pathology. The red polypoid lesions lateral to the right uterosacral ligament were carefully ablated with needle electrode cutting current.  Pelvis was copiously irrigated with lactated  Ringer solution and a  slurry of Seprafilm (2 sheets in 40 mL of lactated Ringer solution) was instilled into the pelvis as an adhesion barrier.   the fascia of the suprapubic incision was closed with 2-0 Vicryl continuous suture. A 3-0 Monocryl interrupted suture was placed on the subcutaneous layer. The instrument and lap pad counts were correct. The rest of the skin incisions were approximated with 4-0 Monocryl testicular stitches and Dermabond was applied to the skin.  The patient tolerated the procedure well and was transferred to recovery room in satisfactory condition.  Special Note: Due to to significant myometrial incisions, the patient is recommended to have cesarean delivery with future pregnancies.  Governor Specking, MD

## 2016-03-25 NOTE — Discharge Instructions (Signed)
DISCHARGE INSTRUCTIONS: Laparoscopy  The following instructions have been prepared to help you care for yourself upon your return home today.  Wound care:  Do not get the incision wet for the first 24 hours. The incision should be kept clean and dry.  The Band-Aids or dressings may be removed the day after surgery.  Should the incision become sore, red, and swollen after the first week, check with your doctor.  Personal hygiene:  Shower the day after your procedure.  Activity and limitations:  Do NOT drive or operate any equipment today.  Do NOT lift anything more than 15 pounds for 2-3 weeks after surgery.  Do NOT rest in bed all day.  Walking is encouraged. Walk each day, starting slowly with 5-minute walks 3 or 4 times a day. Slowly increase the length of your walks.  Walk up and down stairs slowly.  Do NOT do strenuous activities, such as golfing, playing tennis, bowling, running, biking, weight lifting, gardening, mowing, or vacuuming for 2-4 weeks. Ask your doctor when it is okay to start.  Diet: Eat a light meal as desired this evening. You may resume your usual diet tomorrow.  Return to work: This is dependent on the type of work you do. For the most part you can return to a desk job within a week of surgery. If you are more active at work, please discuss this with your doctor.  What to expect after your surgery: You may have a slight burning sensation when you urinate on the first day. You may have a very small amount of blood in the urine. Expect to have a small amount of vaginal discharge/light bleeding for 1-2 weeks. It is not unusual to have abdominal soreness and bruising for up to 2 weeks. You may be tired and need more rest for about 1 week. You may experience shoulder pain for 24-72 hours. Lying flat in bed may relieve it.  Call your doctor for any of the following:  Develop a fever of 100.4 or greater  Inability to urinate 6 hours after discharge from hospital  Severe  pain not relieved by pain medications  Persistent of heavy bleeding at incision site  Redness or swelling around incision site after a week  Increasing nausea or vomiting  Patient Signature________________________________________ Nurse Signature_________________________________________ Post Anesthesia Home Care Instructions  Activity: Get plenty of rest for the remainder of the day. A responsible adult should stay with you for 24 hours following the procedure.  For the next 24 hours, DO NOT: -Drive a car -Operate machinery -Drink alcoholic beverages -Take any medication unless instructed by your physician -Make any legal decisions or sign important papers.  Meals: Start with liquid foods such as gelatin or soup. Progress to regular foods as tolerated. Avoid greasy, spicy, heavy foods. If nausea and/or vomiting occur, drink only clear liquids until the nausea and/or vomiting subsides. Call your physician if vomiting continues.  Special Instructions/Symptoms: Your throat may feel dry or sore from the anesthesia or the breathing tube placed in your throat during surgery. If this causes discomfort, gargle with warm salt water. The discomfort should disappear within 24 hours.  If you had a scopolamine patch placed behind your ear for the management of post- operative nausea and/or vomiting:  1. The medication in the patch is effective for 72 hours, after which it should be removed.  Wrap patch in a tissue and discard in the trash. Wash hands thoroughly with soap and water. 2. You may remove the patch   earlier than 72 hours if you experience unpleasant side effects which may include dry mouth, dizziness or visual disturbances. 3. Avoid touching the patch. Wash your hands with soap and water after contact with the patch.   

## 2016-03-25 NOTE — Transfer of Care (Signed)
Immediate Anesthesia Transfer of Care Note  Patient: Grace Campbell  Procedure(s) Performed: Procedure(s): LAPAROSCOPIC GELPORT ASSISTED MYOMECTOMY (N/A) EXCISION AND ABLATION OF ENDOMETRIOSIS (N/A)  Patient Location: PACU  Anesthesia Type:General  Level of Consciousness: awake, alert , oriented and patient cooperative  Airway & Oxygen Therapy: Patient Spontanous Breathing and Patient connected to nasal cannula oxygen  Post-op Assessment: Report given to RN and Post -op Vital signs reviewed and stable  Post vital signs: Reviewed and stable--patient with elevated BP; MDA made aware.  Last Vitals:  Vitals:   03/25/16 0945  BP: 121/82  Pulse: (!) 59  Resp: 18  Temp: 36.6 C    Last Pain:  Vitals:   03/25/16 0945  TempSrc: Oral         Complications: No apparent anesthesia complications

## 2016-03-25 NOTE — Anesthesia Postprocedure Evaluation (Signed)
Anesthesia Post Note  Patient: Grace Campbell  Procedure(s) Performed: Procedure(s) (LRB): LAPAROSCOPIC GELPORT ASSISTED MYOMECTOMY (N/A) EXCISION AND ABLATION OF ENDOMETRIOSIS (N/A)  Patient location during evaluation: PACU Anesthesia Type: General Level of consciousness: awake and alert Pain management: pain level controlled Vital Signs Assessment: post-procedure vital signs reviewed and stable Respiratory status: spontaneous breathing, nonlabored ventilation, respiratory function stable and patient connected to nasal cannula oxygen Cardiovascular status: blood pressure returned to baseline and stable Postop Assessment: no signs of nausea or vomiting Anesthetic complications: no     Last Vitals:  Vitals:   03/25/16 1545 03/25/16 1600  BP: 124/86 125/87  Pulse: (!) 58 60  Resp: 16 16  Temp:  36.5 C    Last Pain:  Vitals:   03/25/16 1600  TempSrc:   PainSc: 4    Pain Goal:                 Alfio Loescher JENNETTE

## 2016-03-25 NOTE — Anesthesia Procedure Notes (Signed)
Procedure Name: Intubation Date/Time: 03/25/2016 11:35 AM Performed by: Raenette Rover Pre-anesthesia Checklist: Patient identified, Emergency Drugs available, Suction available and Patient being monitored Patient Re-evaluated:Patient Re-evaluated prior to inductionOxygen Delivery Method: Circle system utilized Preoxygenation: Pre-oxygenation with 100% oxygen Intubation Type: IV induction Ventilation: Mask ventilation without difficulty Laryngoscope Size: Mac and 3 Grade View: Grade I Tube type: Oral Tube size: 7.0 mm Number of attempts: 1 Airway Equipment and Method: Stylet Placement Confirmation: CO2 detector,  positive ETCO2,  ETT inserted through vocal cords under direct vision and breath sounds checked- equal and bilateral Secured at: 21 cm Tube secured with: Tape Dental Injury: Teeth and Oropharynx as per pre-operative assessment

## 2016-03-25 NOTE — Anesthesia Preprocedure Evaluation (Signed)
Anesthesia Evaluation  Patient identified by MRN, date of birth, ID band Patient awake    Reviewed: Allergy & Precautions, NPO status , Patient's Chart, lab work & pertinent test results  History of Anesthesia Complications Negative for: history of anesthetic complications  Airway Mallampati: II  TM Distance: >3 FB Neck ROM: Full    Dental no notable dental hx. (+) Dental Advisory Given   Pulmonary asthma ,    Pulmonary exam normal breath sounds clear to auscultation       Cardiovascular negative cardio ROS Normal cardiovascular exam Rhythm:Regular Rate:Normal     Neuro/Psych  Headaches, PSYCHIATRIC DISORDERS Anxiety Depression    GI/Hepatic Neg liver ROS, GERD  Controlled,  Endo/Other  negative endocrine ROS  Renal/GU negative Renal ROS  negative genitourinary   Musculoskeletal  (+) Arthritis ,   Abdominal   Peds negative pediatric ROS (+)  Hematology negative hematology ROS (+)   Anesthesia Other Findings   Reproductive/Obstetrics negative OB ROS                             Anesthesia Physical Anesthesia Plan  ASA: II  Anesthesia Plan: General   Post-op Pain Management:    Induction: Intravenous  Airway Management Planned: Oral ETT  Additional Equipment:   Intra-op Plan:   Post-operative Plan: Extubation in OR  Informed Consent: I have reviewed the patients History and Physical, chart, labs and discussed the procedure including the risks, benefits and alternatives for the proposed anesthesia with the patient or authorized representative who has indicated his/her understanding and acceptance.   Dental advisory given  Plan Discussed with: CRNA  Anesthesia Plan Comments:         Anesthesia Quick Evaluation

## 2016-03-26 ENCOUNTER — Encounter (HOSPITAL_COMMUNITY): Payer: Self-pay | Admitting: Obstetrics and Gynecology

## 2016-05-06 ENCOUNTER — Telehealth: Payer: Self-pay | Admitting: *Deleted

## 2016-05-06 NOTE — Telephone Encounter (Signed)
Patient dropped off a [facility] health examination form for completion. This form requires an appt for CPE and testing for TB and/or review of immunizations or tiers drawn. Called patient to let her know but her mail box was full and I was unable to leave a message.

## 2016-05-07 NOTE — Telephone Encounter (Signed)
Tried to call patient mailbox is full unable to leave a message.

## 2016-05-15 ENCOUNTER — Encounter: Payer: BC Managed Care – PPO | Admitting: Family Medicine

## 2016-05-18 ENCOUNTER — Encounter: Payer: BC Managed Care – PPO | Admitting: Family Medicine

## 2016-05-18 NOTE — Telephone Encounter (Signed)
Patient has cancelled several appt times she had scheduled for CPE. We are unable to complete her health form until she completes her CPE. Have called patient several times but her voice mail is full and unable to leave a message.

## 2016-05-25 ENCOUNTER — Encounter: Payer: BC Managed Care – PPO | Admitting: Family Medicine

## 2016-05-27 ENCOUNTER — Encounter: Payer: BC Managed Care – PPO | Admitting: Family Medicine

## 2016-06-01 ENCOUNTER — Ambulatory Visit (INDEPENDENT_AMBULATORY_CARE_PROVIDER_SITE_OTHER): Payer: BC Managed Care – PPO | Admitting: Family Medicine

## 2016-06-01 ENCOUNTER — Encounter: Payer: Self-pay | Admitting: Family Medicine

## 2016-06-01 VITALS — BP 126/82 | HR 70 | Temp 98.6°F | Resp 20 | Ht 66.0 in | Wt 120.5 lb

## 2016-06-01 DIAGNOSIS — Z111 Encounter for screening for respiratory tuberculosis: Secondary | ICD-10-CM | POA: Diagnosis not present

## 2016-06-01 DIAGNOSIS — Z0000 Encounter for general adult medical examination without abnormal findings: Secondary | ICD-10-CM

## 2016-06-01 DIAGNOSIS — K146 Glossodynia: Secondary | ICD-10-CM

## 2016-06-01 DIAGNOSIS — Z1321 Encounter for screening for nutritional disorder: Secondary | ICD-10-CM | POA: Diagnosis not present

## 2016-06-01 DIAGNOSIS — Z130 Encounter for screening for diseases of the blood and blood-forming organs and certain disorders involving the immune mechanism: Secondary | ICD-10-CM

## 2016-06-01 DIAGNOSIS — T280XXA Burn of mouth and pharynx, initial encounter: Secondary | ICD-10-CM

## 2016-06-01 LAB — CBC WITH DIFFERENTIAL/PLATELET
BASOS ABS: 0 10*3/uL (ref 0.0–0.1)
BASOS PCT: 0.6 % (ref 0.0–3.0)
EOS ABS: 0 10*3/uL (ref 0.0–0.7)
Eosinophils Relative: 0.7 % (ref 0.0–5.0)
HCT: 37.1 % (ref 36.0–46.0)
HEMOGLOBIN: 12.6 g/dL (ref 12.0–15.0)
Lymphocytes Relative: 48.3 % — ABNORMAL HIGH (ref 12.0–46.0)
Lymphs Abs: 2.6 10*3/uL (ref 0.7–4.0)
MCHC: 33.9 g/dL (ref 30.0–36.0)
MCV: 94.4 fl (ref 78.0–100.0)
MONO ABS: 0.4 10*3/uL (ref 0.1–1.0)
Monocytes Relative: 6.7 % (ref 3.0–12.0)
NEUTROS ABS: 2.3 10*3/uL (ref 1.4–7.7)
Neutrophils Relative %: 43.7 % (ref 43.0–77.0)
PLATELETS: 275 10*3/uL (ref 150.0–400.0)
RBC: 3.93 Mil/uL (ref 3.87–5.11)
RDW: 14.6 % (ref 11.5–15.5)
WBC: 5.3 10*3/uL (ref 4.0–10.5)

## 2016-06-01 LAB — VITAMIN D 25 HYDROXY (VIT D DEFICIENCY, FRACTURES): VITD: 15.64 ng/mL — AB (ref 30.00–100.00)

## 2016-06-01 NOTE — Patient Instructions (Addendum)

## 2016-06-01 NOTE — Progress Notes (Signed)
Patient ID: Grace Campbell, female  DOB: 1990/07/18, 25 y.o.   MRN: [id number] Patient Care Team    Relationship Specialty Notifications Start End  Ma Hillock, DO PCP - General Family Medicine  01/21/16   Milus Banister, MD Attending Physician Gastroenterology  01/21/16   Eldred Manges, MD Consulting Physician Obstetrics and Gynecology  06/01/16     Subjective:  Grace Campbell is a 25 y.o.  Female  present for CPE. All past medical history, surgical history, allergies, family history, immunizations, medications and social history were updated in the electronic medical record today. All recent labs, ED visits and hospitalizations within the last year were reviewed.   Health maintenance:  Colonoscopy: FHX Amt Aunt, screen at 50 Mammogram: No Fhx, screen at 40 Cervical cancer screening: followed by GYN, 2015 last PAP, follows with Dr. Leo Grosser.  Immunizations: tdap 07/2012 UTD, Influenza declined today (encouraged yearly) Infectious disease screening: HIV unknown DEXA: N/A Assistive device: none Oxygen [id number]  Patient has a Dental home. Hospitalizations/ED visits: none  Immunization History  Administered Date(s) Administered  . Tdap 07/06/2013     Past Medical History:  Diagnosis Date  . Allergy   . Anemia    history of  . Anxiety   . Arthritis   . Asthma   . Chicken pox   . Colitis   . Depression    has been followed by psych in the past, Dr. Rolm Gala.   . Fibroids    had lupron injections, pt to have myomectomy  . GERD (gastroesophageal reflux disease)   . H. pylori infection 2013  . Lactose intolerance   . Migraines   . Salmonella    Allergies  Allergen Reactions  . Prozac [Fluoxetine Hcl] Other (See Comments)    Suicidal thoughts  . Ibuprofen Other (See Comments)    Causes ulcers   Past Surgical History:  Procedure Laterality Date  . ABLATION ON ENDOMETRIOSIS N/A 03/25/2016   Procedure: EXCISION AND ABLATION OF ENDOMETRIOSIS;  Surgeon:  Governor Specking, MD;  Location: [facility];  Service: Gynecology;  Laterality: N/A;  . COLONOSCOPY W/ BIOPSIES  2017  . LAPAROSCOPIC GELPORT ASSISTED MYOMECTOMY N/A 03/25/2016   Procedure: LAPAROSCOPIC GELPORT ASSISTED MYOMECTOMY;  Surgeon: Governor Specking, MD;  Location: [facility];  Service: Gynecology;  Laterality: N/A;  . wisdom teeth extracted  2008, 2011   Family History  Problem Relation Age of Onset  . Colon cancer Maternal Aunt   . Alcohol abuse Maternal Grandfather   . Diabetes Paternal Grandmother   . Alcohol abuse Maternal Uncle   . Esophageal cancer Neg Hx   . Rectal cancer Neg Hx   . Stomach cancer Neg Hx    Social History   Social History  . Marital status: Single    Spouse name: N/A  . Number of children: N/A  . Years of education: N/A   Occupational History  . Not on file.   Social History Main Topics  . Smoking status: Never Smoker  . Smokeless tobacco: Never Used  . Alcohol use 0.6 oz/week    1 Glasses of wine per week  . Drug use: No  . Sexual activity: No     Comment: No sex past 4-5 months   Other Topics Concern  . Not on file   Social History Narrative   Marital status: single; not dating.      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       Lives: with parents.       Tobacco; none      Alcohol: socially      Drugs: none      Exercise: none   Drinks caffeine   Wears her seatbelt.   Does not eat red meat or dairy products.    Smoke detector in the home   Feels safe in her relationships.         Medication List       Accurate as of 06/01/16 11:50 AM. Always use your most recent med list.          clindamycin 1 % external solution Commonly known as:  CLEOCIN T Apply 1 application topically every morning.   polyethylene glycol packet Commonly known as:  MIRALAX / GLYCOLAX Take 17 g by mouth daily.   tretinoin 0.05 % cream Commonly known as:   RETIN-A APPLY TO FACE NIGHTLY TO TOLERANCE.        No results found.   ROS: 14 pt review of systems performed and negative (unless mentioned in an HPI)  Objective: BP 126/82 (BP Location: Right Arm, Patient Position: Sitting, Cuff Size: Normal)   Pulse 70   Temp 98.6 F (37 C)   Resp 20   Ht 5\' 6"  (1.676 m)   Wt 120 lb 8 oz (54.7 kg)   SpO2 98%   BMI 19.45 kg/m  Gen: Afebrile. No acute distress. Nontoxic in appearance, well-developed, well-nourished,  Very pleasant AAF.  HENT: AT. [place]. Bilateral TM visualized and normal in appearance, normal external auditory canal. MMM, ~ 2 mm excoriated blister left mid tongue,, adequate dentition. Bilateral nares within normal limits. Throat without erythema, ulcerations or exudates. no Cough on exam, no hoarseness on exam. Eyes:Pupils Equal Round Reactive to light, Extraocular movements intact,  Conjunctiva without redness, discharge or icterus. Neck/lymp/endocrine: Supple,no lymphadenopathy, no thyromegaly CV: RRR no murmur, no edema, +2/4 P posterior tibialis pulses.  Chest: CTAB, no wheeze, rhonchi or crackles. Normal  Respiratory effort. good Air movement. Abd: Soft. flat. NTND. BS present. no Masses palpated. No hepatosplenomegaly. No rebound tenderness or guarding. Skin: no rashes, purpura or petechiae. Warm and well-perfused. Skin intact. Neuro/Msk:  Normal gait. PERLA. EOMi. Alert. Oriented x3.  Cranial nerves II through XII intact. Muscle strength 5/5 upper/lower extremity. DTRs equal bilaterally. Psych: Normal affect, dress and demeanor. Normal speech. Normal thought content and judgment.  Assessment/plan: Grace Campbell is a 25 y.o. female present for CPE. Encounter for preventive health examination Patient was encouraged to exercise greater than 150 minutes a week. Patient was encouraged to choose a diet filled with fresh fruits and vegetables, and lean meats. AVS provided to patient today for education/recommendation on gender  specific health and safety maintenance. - CBC w/Diff - Vitamin D (25 hydroxy) - all health maintenance UTD, declined flu shot today - form will be completed once results of TB test verified.  Screening for tuberculosis - needed for employment - PPD - return in 48-72 hours to have reading.  Screening for deficiency anemia - CBC w/Diff Encounter for vitamin deficiency screening - Vitamin D (25 hydroxy) Burn of tongue - burned her tongue on a french fry, she does have ~2 mm round excoriated blister left mid tongue.  - does not appear infected, encouraged her to use salt water swish TID. Monitor for signs of infection (redness, swelling, pain, fever) if occur she is to be seen, she may need  abx treatment.     Return in about 1 year (around 06/01/2017) for CPE.  Electronically signed by: Howard Pouch, DO [place]

## 2016-06-02 ENCOUNTER — Telehealth: Payer: Self-pay | Admitting: Family Medicine

## 2016-06-02 DIAGNOSIS — E538 Deficiency of other specified B group vitamins: Secondary | ICD-10-CM

## 2016-06-02 MED ORDER — ERGOCALCIFEROL 50 MCG (2000 UT) PO TABS: 1.0000 | ORAL_TABLET | Freq: Every day | ORAL | 0 refills | Status: DC

## 2016-06-02 NOTE — Telephone Encounter (Signed)
Please call pt: - her Vit d is rather low (15), would like to see this around 40. I have called in a daily pill for her to take with a meal for 12 weeks and then she will need retested by lab appt only to see if adequately supplemented.

## 2016-06-02 NOTE — Telephone Encounter (Signed)
Patient notified and verbalized understanding.  Future order is placed for lab.

## 2016-06-03 ENCOUNTER — Ambulatory Visit: Payer: BC Managed Care – PPO | Admitting: *Deleted

## 2016-06-03 DIAGNOSIS — Z111 Encounter for screening for respiratory tuberculosis: Secondary | ICD-10-CM

## 2016-06-03 LAB — TB SKIN TEST
Induration: 0 mm
TB Skin Test: NEGATIVE

## 2016-06-03 NOTE — Progress Notes (Signed)
Patient presents today for PPD reading. Test neg 96mm

## 2016-07-29 ENCOUNTER — Encounter: Payer: Self-pay | Admitting: Family Medicine

## 2016-07-29 ENCOUNTER — Ambulatory Visit (INDEPENDENT_AMBULATORY_CARE_PROVIDER_SITE_OTHER): Payer: BC Managed Care – PPO | Admitting: Family Medicine

## 2016-07-29 VITALS — BP 111/73 | HR 72 | Temp 98.4°F | Resp 18 | Ht 66.0 in | Wt 121.8 lb

## 2016-07-29 DIAGNOSIS — K219 Gastro-esophageal reflux disease without esophagitis: Secondary | ICD-10-CM

## 2016-07-29 DIAGNOSIS — R1011 Right upper quadrant pain: Secondary | ICD-10-CM | POA: Diagnosis not present

## 2016-07-29 LAB — COMPLETE METABOLIC PANEL WITH GFR
ALT: 8 U/L (ref 6–29)
AST: 15 U/L (ref 10–30)
Albumin: 4 g/dL (ref 3.6–5.1)
Alkaline Phosphatase: 43 U/L (ref 33–115)
BUN: 13 mg/dL (ref 7–25)
CHLORIDE: 109 mmol/L (ref 98–110)
CO2: 23 mmol/L (ref 20–31)
Calcium: 9.2 mg/dL (ref 8.6–10.2)
Creat: 0.77 mg/dL (ref 0.50–1.10)
Glucose, Bld: 84 mg/dL (ref 65–99)
Potassium: 4.3 mmol/L (ref 3.5–5.3)
Sodium: 140 mmol/L (ref 135–146)
Total Bilirubin: 0.9 mg/dL (ref 0.2–1.2)
Total Protein: 6.7 g/dL (ref 6.1–8.1)

## 2016-07-29 LAB — LIPASE: LIPASE: 11 U/L (ref 11.0–59.0)

## 2016-07-29 LAB — C-REACTIVE PROTEIN: CRP: <0.1 mg/dL — ABNORMAL LOW (ref 0.5–20.0)

## 2016-07-29 MED ORDER — OMEPRAZOLE 20 MG PO CPDR
20.0000 mg | DELAYED_RELEASE_CAPSULE | Freq: Every day | ORAL | 3 refills | Status: DC
Start: 2016-07-29 — End: 2017-01-25

## 2016-07-29 NOTE — Patient Instructions (Signed)
We will call you with results of labs once available.  Start omeprazole today.   They will call you to schedule ultrasound.    Watch for food triggers and try to cut back on the dairy.

## 2016-07-29 NOTE — Progress Notes (Signed)
Grace Campbell , 1991-02-08, 26 y.o., female MRN: [id number] Patient Care Team    Relationship Specialty Notifications Start End  Ma Hillock, DO PCP - General Family Medicine  01/21/16   Milus Banister, MD Attending Physician Gastroenterology  01/21/16   Eldred Manges, MD Consulting Physician Obstetrics and Gynecology  06/01/16     CC: Abdominal pain/back pain Subjective: Pt presents for an acute OV with complaints of chronic abdominal pain/back pain. She has an extensive history of large fibroids, that she recently underwent a myomectomy. She also has IBS and endometriosis, which affect her abdominal discomfort. She's had a colonoscopy for severe colitis in 2017. She presents today stating that she's never had full resolution or comfort since her colitis episode last year in January. She reported taking a little better, but more recently has become more persistent discomfort. He has a history of GERD, but states that she has noticed increase in belching. She has problems with constipation, and states that her abdominal pain is more of a cramping pain, sometimes has dysuria, but usually is constipated, still are painful and they are sometimes dark. She is not taking softener. She denies fever, chills, dysuria or melena. He points to area area of her back pain to be higher in the thoracic area, over ribs.  Prior note: Grace Campbell is a 26 y.o.  female present for back pain, which started 2 days ago. Pt denies increase in activity, lifting, bending, trauma, fever, chills, nausea, vomit, diarrhea or constipation.  She does not have a h/o kidney stones. She does have a h/o large fibroids and amenorrhea since 05/2015 from gynecological issues. She has a h/o of IBS and constipation. She states the day before her back pain she was having urinary frequency and cramping in her lower abdomen. She denies dysuria, vaginal discharge or lesions. She has a decreased appetite, but tolerating PO. She has  had a BM everyday, but only small amounts. She has tried to take aleve for the pain, which helped some. Flexion of the back makes her lumbar pain better. She has not had radiation of pain to extremities. She was unable to sleep last secondary to discomfort.   Allergies  Allergen Reactions  . Prozac [Fluoxetine Hcl] Other (See Comments)    Suicidal thoughts  . Ibuprofen Other (See Comments)    Causes ulcers   Social History  Substance Use Topics  . Smoking status: Never Smoker  . Smokeless tobacco: Never Used  . Alcohol use 0.6 oz/week    1 Glasses of wine per week   Past Medical History:  Diagnosis Date  . Allergy   . Anemia    history of  . Anxiety   . Arthritis   . Asthma   . Chicken pox   . Colitis   . Depression    has been followed by psych in the past, Dr. Rolm Gala.   . Fibroids    had lupron injections, pt to have myomectomy  . GERD (gastroesophageal reflux disease)   . H. pylori infection 2013  . Lactose intolerance   . Migraines   . Salmonella    Past Surgical History:  Procedure Laterality Date  . ABLATION ON ENDOMETRIOSIS N/A 03/25/2016   Procedure: EXCISION AND ABLATION OF ENDOMETRIOSIS;  Surgeon: Governor Specking, MD;  Location: [facility];  Service: Gynecology;  Laterality: N/A;  . COLONOSCOPY W/ BIOPSIES  2017  . LAPAROSCOPIC GELPORT ASSISTED MYOMECTOMY N/A 03/25/2016   Procedure: LAPAROSCOPIC GELPORT ASSISTED  MYOMECTOMY;  Surgeon: Governor Specking, MD;  Location: [facility];  Service: Gynecology;  Laterality: N/A;  . wisdom teeth extracted  2008, 2011   Family History  Problem Relation Age of Onset  . Colon cancer Maternal Aunt   . Alcohol abuse Maternal Grandfather   . Diabetes Paternal Grandmother   . Alcohol abuse Maternal Uncle   . Esophageal cancer Neg Hx   . Rectal cancer Neg Hx   . Stomach cancer Neg Hx    Allergies as of 07/29/2016      Reactions   Prozac [fluoxetine Hcl] Other (See Comments)   Suicidal thoughts   Ibuprofen Other (See  Comments)   Causes ulcers      Medication List       Accurate as of 07/29/16 10:21 AM. Always use your most recent med list.          polyethylene glycol packet Commonly known as:  MIRALAX / GLYCOLAX Take 17 g by mouth daily.   tretinoin 0.05 % cream Commonly known as:  RETIN-A APPLY TO FACE NIGHTLY TO TOLERANCE.       No results found for this or any previous visit (from the past 24 hour(s)). No results found.   ROS: Negative, with the exception of above mentioned in HPI   Objective:  BP 111/73 (BP Location: Right Arm, Patient Position: Sitting, Cuff Size: Normal)   Pulse 72   Temp 98.4 F (36.9 C)   Resp 18   Ht 5\' 6"  (1.676 m)   Wt 121 lb 12 oz (55.2 kg)   LMP 07/27/2016   SpO2 98%   BMI 19.65 kg/m  Body mass index is 19.65 kg/m. Gen: Afebrile. No acute distress. Nontoxic in appearance, well developed, well nourished. Very pleasant African-American female. HENT: AT. [place]. MMM, no oral lesions.  Eyes:Pupils Equal Round Reactive to light, Extraocular movements intact,  Conjunctiva without redness, discharge or icterus. CV: RRR  Chest: CTAB, no wheeze or crackles. Good air movement, normal resp effort.  Abd: Soft. Flat. ND. Mild discomfort epigastric. BS present. No Masses palpated. No rebound or guarding.  MSK: No tenderness to palpation thoracic spine or ribs. Normal range of motion. Neuro: Normal gait. PERLA. EOMi. Alert.  Psych: Normal affect, dress and demeanor. Normal speech. Normal thought content and judgment.  Assessment/Plan: Grace Campbell is a 26 y.o. female present for OV for  Right upper quadrant abdominal pain - Usually having pumps constipation today complains of intermittent diarrhea. She has a history of IBS. It is difficult to maneuver down her differential today by her description of symptoms. She reports she's lost weight, but her weight has been stable according to our records. Although her pain computer stay fuse, is more epigastric and  right upper quadrant radiating towards the back. Will order a right upper quadrant ultrasound to be thorough, as well as CMP, lipase and CRP. - SHe is lactose deficient, and continues to consume lactose. Asked her to cut back on dairy products and see if that is helpful. - She has a history of reflux, prescribed Prilosec to be restarted today. - Consider starting Bentyl. - Consider probiotic. - COMPLETE METABOLIC PANEL WITH GFR - C-reactive protein - Lipase - US Abdomen Limited RUQ; Future - Patient to follow-up in 2-4 weeks, depending upon labs and clinical outcome if pain is not resolved.  Gastroesophageal reflux disease without esophagitis - If she does not see improvement in 4 weeks, she can discontinue. Otherwise finish a course of 12 weeks. - omeprazole (  PRILOSEC) 20 MG capsule; Take 1 capsule (20 mg total) by mouth daily.  Dispense: 30 capsule; Refill: 3   electronically signed by:  Howard Pouch, DO  [place]

## 2016-07-30 ENCOUNTER — Telehealth: Payer: Self-pay | Admitting: Family Medicine

## 2016-07-30 NOTE — Telephone Encounter (Signed)
Please call patient: Her labs are normal. Had placed in order for the ultrasound of the right upper quadrant as we discussed, they should be calling her to schedule this. In the meantime watch for food triggers, cut back on the dairy products and start the omeprazole.  - If the ultrasound is normal, we will follow-up on her stomach in 2-4 weeks, sooner if it is worsening. If the discomfort completely resolves, do not need to follow-up, and she is to complete the 12 week course of Prilosec.

## 2016-07-30 NOTE — Telephone Encounter (Signed)
Spoke with patient reviewed information and instructions. Patient verbalized understanding. 

## 2016-08-04 ENCOUNTER — Ambulatory Visit (HOSPITAL_BASED_OUTPATIENT_CLINIC_OR_DEPARTMENT_OTHER): Payer: BC Managed Care – PPO

## 2017-01-22 ENCOUNTER — Ambulatory Visit (INDEPENDENT_AMBULATORY_CARE_PROVIDER_SITE_OTHER): Payer: BC Managed Care – PPO

## 2017-01-22 DIAGNOSIS — Z111 Encounter for screening for respiratory tuberculosis: Secondary | ICD-10-CM

## 2017-01-22 NOTE — Progress Notes (Signed)
Patient presents today for TB test. Given in Right forearm with no problem or incidence. Patient left with no complaints and will return Monday to have read.

## 2017-01-25 ENCOUNTER — Ambulatory Visit (INDEPENDENT_AMBULATORY_CARE_PROVIDER_SITE_OTHER): Payer: BC Managed Care – PPO | Admitting: Family Medicine

## 2017-01-25 ENCOUNTER — Encounter: Payer: Self-pay | Admitting: Family Medicine

## 2017-01-25 VITALS — BP 122/80 | HR 72 | Temp 98.1°F | Resp 20 | Ht 66.0 in | Wt 119.5 lb

## 2017-01-25 DIAGNOSIS — Z0289 Encounter for other administrative examinations: Secondary | ICD-10-CM

## 2017-01-25 DIAGNOSIS — Z0000 Encounter for general adult medical examination without abnormal findings: Secondary | ICD-10-CM | POA: Diagnosis not present

## 2017-01-25 LAB — TB SKIN TEST
Induration: 0 mm
TB SKIN TEST: NEGATIVE

## 2017-01-25 NOTE — Progress Notes (Signed)
Patient ID: Grace Campbell, female  DOB: May 08, 1991, 26 y.o.   MRN: [id number] Patient Care Team    Relationship Specialty Notifications Start End  Ma Hillock, DO PCP - General Family Medicine  01/21/16   Milus Banister, MD Attending Physician Gastroenterology  01/21/16   Eldred Manges, MD Consulting Physician Obstetrics and Gynecology  06/01/16     Subjective:  Grace Campbell is a 26 y.o.  Female  present for physical for employment. All past medical history, surgical history, allergies, family history, immunizations, medications and social history were updated in the electronic medical record today. All recent labs, ED visits and hospitalizations within the last year were reviewed.   Patient is in need of a preemployment physical. She is starting a new job opportunity in [place] this coming week. She is required to have a physical within 90 days of employment. Patient has no complaints today.  Health maintenance:  Colonoscopy: FHX Amt Aunt, screen at 50 Mammogram: No Fhx, screen at 40 Cervical cancer screening: followed by GYN, 2015 last PAP, follows with Dr. Leo Grosser.  Immunizations: tdap 07/2013 UTD, Influenza declined today (encouraged yearly) Infectious disease screening: HIV completed 2017 DEXA: N/A Assistive device: none Oxygen QMG:QQPY  Patient has a Dental home. Hospitalizations/ED visits: none  Immunization History  Administered Date(s) Administered  . PPD Test 06/01/2016, 01/22/2017  . Tdap 07/06/2013     Past Medical History:  Diagnosis Date  . Allergy   . Anemia    history of  . Anxiety   . Arthritis   . Asthma   . Chicken pox   . Colitis   . Depression    has been followed by psych in the past, Dr. Rolm Gala.   . Fibroids    had lupron injections, pt to have myomectomy  . GERD (gastroesophageal reflux disease)   . H. pylori infection 2013  . Lactose intolerance   . Migraines   . Salmonella    Allergies  Allergen  Reactions  . Prozac [Fluoxetine Hcl] Other (See Comments)    Suicidal thoughts  . Ibuprofen Other (See Comments)    Causes ulcers   Past Surgical History:  Procedure Laterality Date  . ABLATION ON ENDOMETRIOSIS N/A 03/25/2016   Procedure: EXCISION AND ABLATION OF ENDOMETRIOSIS;  Surgeon: Governor Specking, MD;  Location: [facility];  Service: Gynecology;  Laterality: N/A;  . COLONOSCOPY W/ BIOPSIES  2017  . LAPAROSCOPIC GELPORT ASSISTED MYOMECTOMY N/A 03/25/2016   Procedure: LAPAROSCOPIC GELPORT ASSISTED MYOMECTOMY;  Surgeon: Governor Specking, MD;  Location: [facility];  Service: Gynecology;  Laterality: N/A;  . wisdom teeth extracted  2008, 2011   Family History  Problem Relation Age of Onset  . Colon cancer Maternal Aunt   . Alcohol abuse Maternal Grandfather   . Diabetes Paternal Grandmother   . Alcohol abuse Maternal Uncle   . Esophageal cancer Neg Hx   . Rectal cancer Neg Hx   . Stomach cancer Neg Hx    Social History   Social History  . Marital status: Single    Spouse name: N/A  . Number of children: N/A  . Years of education: N/A   Occupational History  . Not on file.   Social History Main Topics  . Smoking status: Never Smoker  . Smokeless tobacco: Never Used  . Alcohol use 0.6 oz/week    1 Glasses of wine per week  . Drug use: No  . Sexual activity: No  Comment: No sex past 4-5 months   Other Topics Concern  . Not on file   Social History Narrative   Marital status: single; not dating.      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       Lives: with parents.       Tobacco; none      Alcohol: socially      Drugs: none      Exercise: none   Drinks caffeine   Wears her seatbelt.   Does not eat red meat or dairy products.    Smoke detector in the home   Feels safe in her relationships.       Allergies as of 01/25/2017      Reactions   Prozac [fluoxetine Hcl] Other (See  Comments)   Suicidal thoughts   Ibuprofen Other (See Comments)   Causes ulcers      Medication List    as of 01/25/2017  4:50 PM   You have not been prescribed any medications.      No results found.   ROS: 14 pt review of systems performed and negative (unless mentioned in an HPI)  Objective: BP 122/80 (BP Location: Right Arm, Patient Position: Sitting, Cuff Size: Normal)   Pulse 72   Temp 98.1 F (36.7 C)   Resp 20   Ht 5\' 6"  (1.676 m)   Wt 119 lb 8 oz (54.2 kg)   SpO2 100%   BMI 19.29 kg/m  Gen: Afebrile. No acute distress. Nontoxic in appearance, well-developed, well-nourished, African-American female. Very pleasant. HENT: AT. [place]. Bilateral TM visualized and normal in appearance. MMM. Bilateral nares without erythema or swelling. Throat without erythema or exudates. No cough, no hoarseness. Eyes:Pupils Equal Round Reactive to light, Extraocular movements intact,  Conjunctiva without redness, discharge or icterus. Neck/lymp/endocrine: Supple, no lymphadenopathy, no thyromegaly CV: RRR no murmur, no edema, +2/4 P posterior tibialis pulses Chest: CTAB, no wheeze or crackles Abd: Soft. Flat. NTND. BS present. No Masses palpated.  MSK: No obvious deformities, full range of motion. Neurovascularly intact distally. Skin: No rashes, purpura or petechiae.  Neuro:  Normal gait. PERLA. EOMi. Alert. Oriented. Cranial nerves II through XII intact. Muscle strength 5/5 upper and lower extremity. DTRs equal bilaterally. Psych: Normal affect, dress and demeanor. Normal speech. Normal thought content and judgment.   Assessment/plan: Grace Campbell is a 26 y.o. female present for CPE. Encounter for employee health examination - All screenings and immunizations UTD. Will need PAP after gets established in [place].  - no restrictions or concerns for new employment. She is moving to [place], [place] tomorrow. - Lab work completed in November 2017 for annual physical, all within normal  limits. Do not feel a need to repeat today.  Screening for tuberculosis - needed for new employment  - PPD--> negative, results read today. Copy provided to patient.   Electronically signed by: Howard Pouch, DO [place]

## 2017-01-25 NOTE — Patient Instructions (Signed)
Good luck to you!!! I am very proud of you.

## 2017-01-26 ENCOUNTER — Encounter: Payer: BC Managed Care – PPO | Admitting: Family Medicine

## 2017-11-22 IMAGING — DX DG ABDOMEN 1V
1 series · 1 of 1 positions shown · non-contrast
Comparison: Abdomen radiographs 01/21/2016. CT Abdomen and Pelvis
06/12/2015

CLINICAL DATA: 24-year-old female with thoracolumbar back pain
since [REDACTED]. Abdominal pain. Initial encounter.

EXAM:
ABDOMEN - 1 VIEW

[abdomen kub]
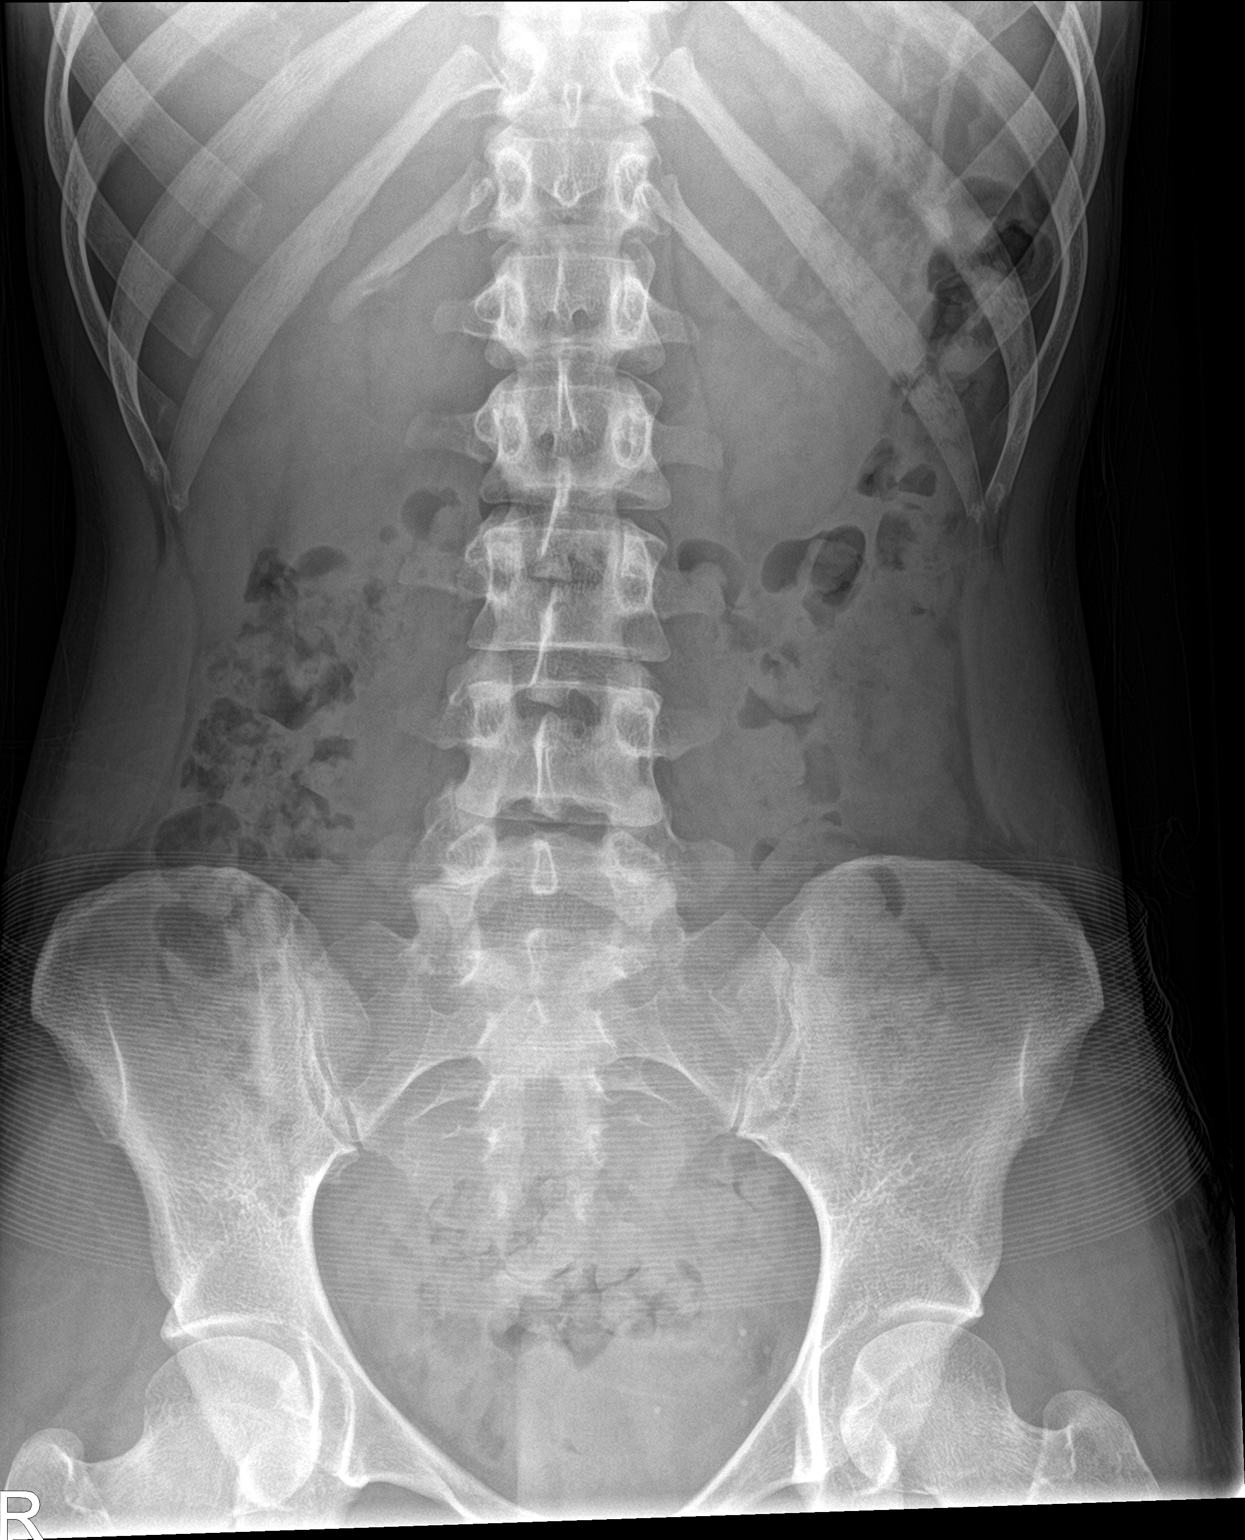

[1 of 1 positions shown; findings below may reference images not displayed]

FINDINGS: KUB view of the abdomen and pelvis. Non obstructed bowel gas
pattern. Abdominal and pelvic visceral contours are normal. Small
pelvic phleboliths re- demonstrated on the left. Small dystrophic
costochondral calcifications on the right project over the right
lateral twelfth rib. No osseous abnormality identified.
IMPRESSION: Negative.  Normal bowel gas pattern.

## 2017-11-22 IMAGING — DX DG LUMBAR SPINE COMPLETE 4+V
5 series · 5 of 5 positions shown · non-contrast
Comparison: None.

CLINICAL DATA: Lobe mid back pain.

EXAM:
LUMBAR SPINE - COMPLETE 4+ VIEW

[l-spine ap]
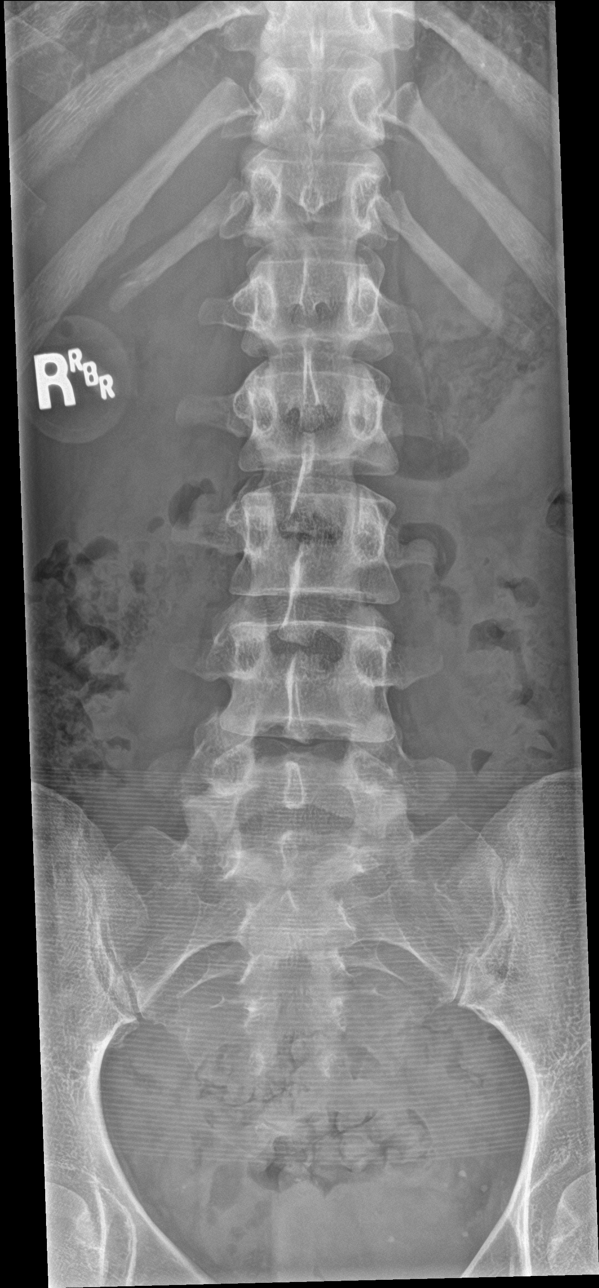

[l-spine obl (1 of 2)]
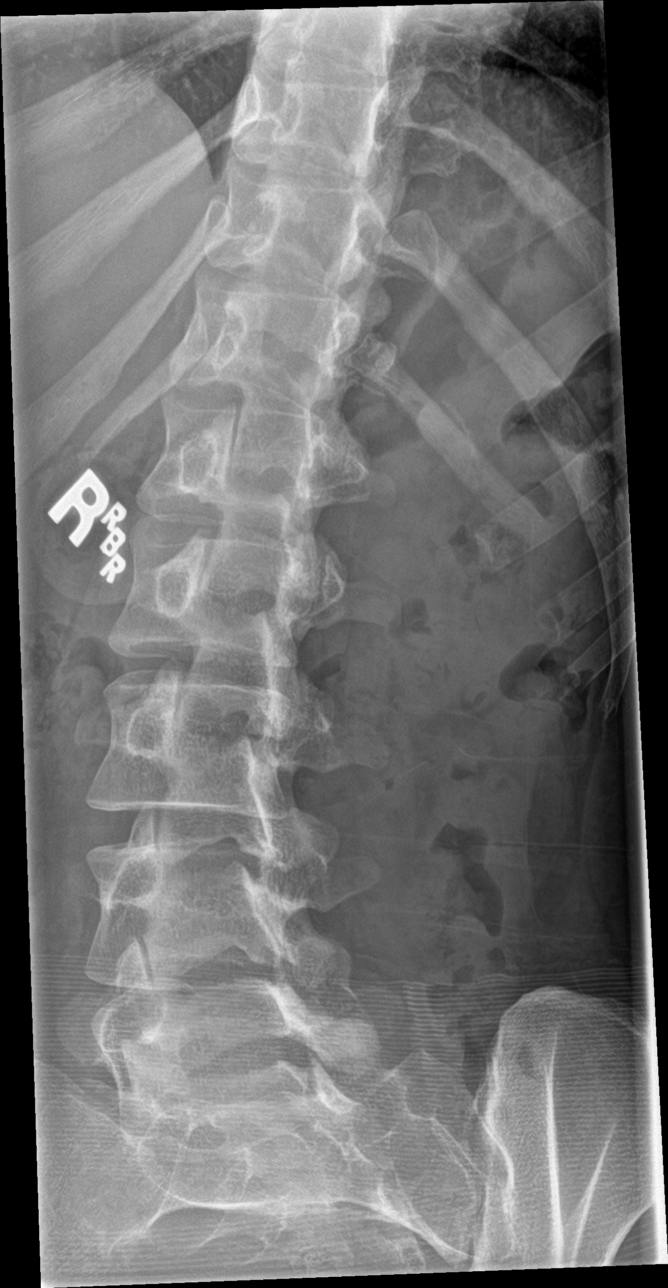

[l-spine obl (2 of 2)]
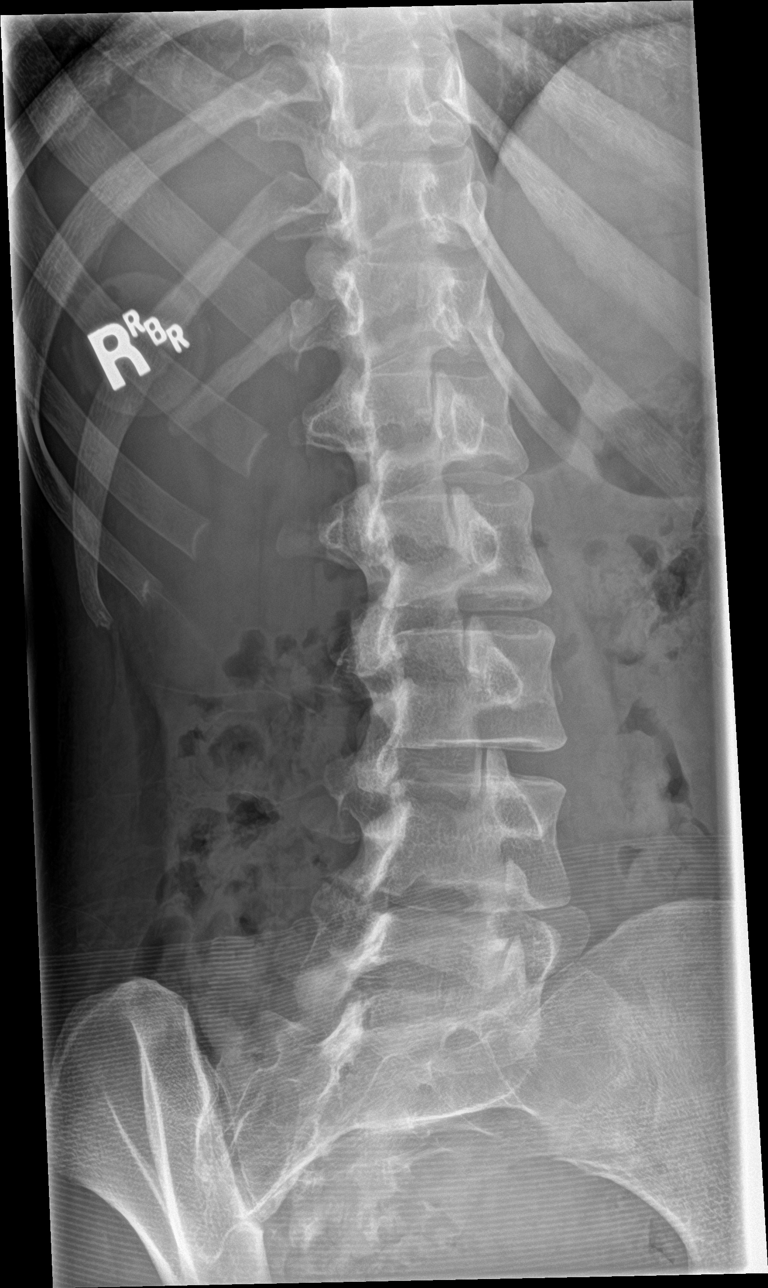

[l-spine lat]
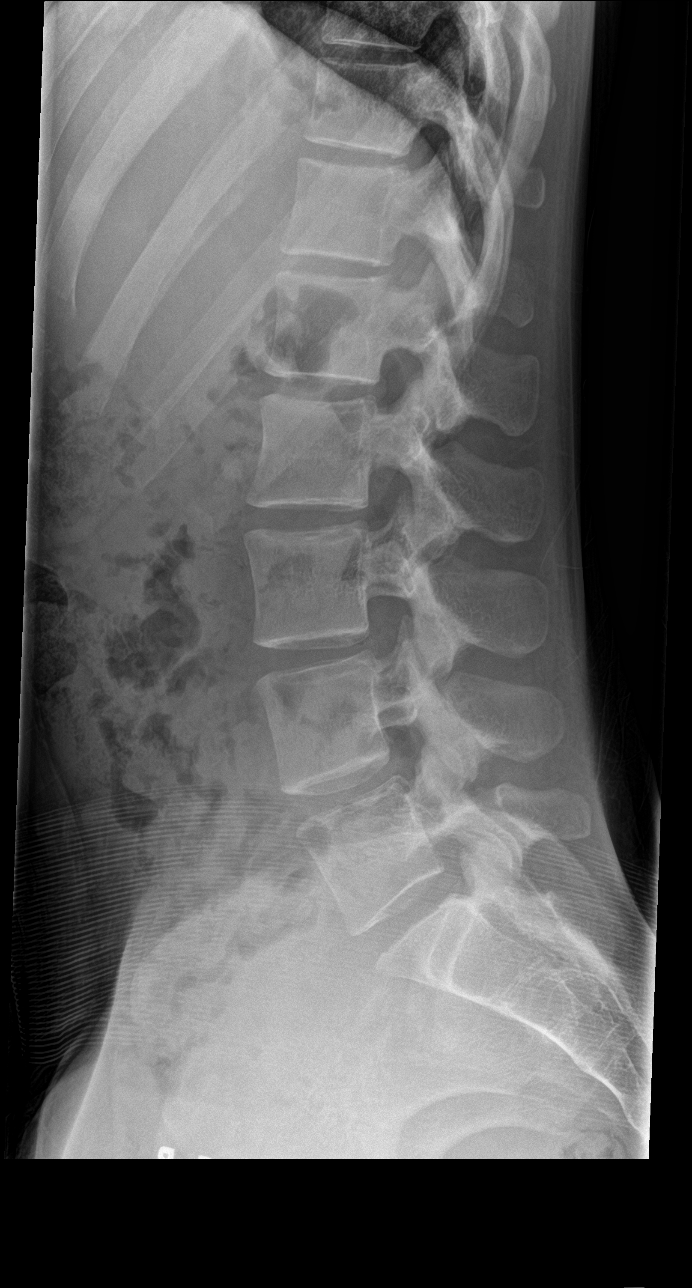

[l-spine spot]
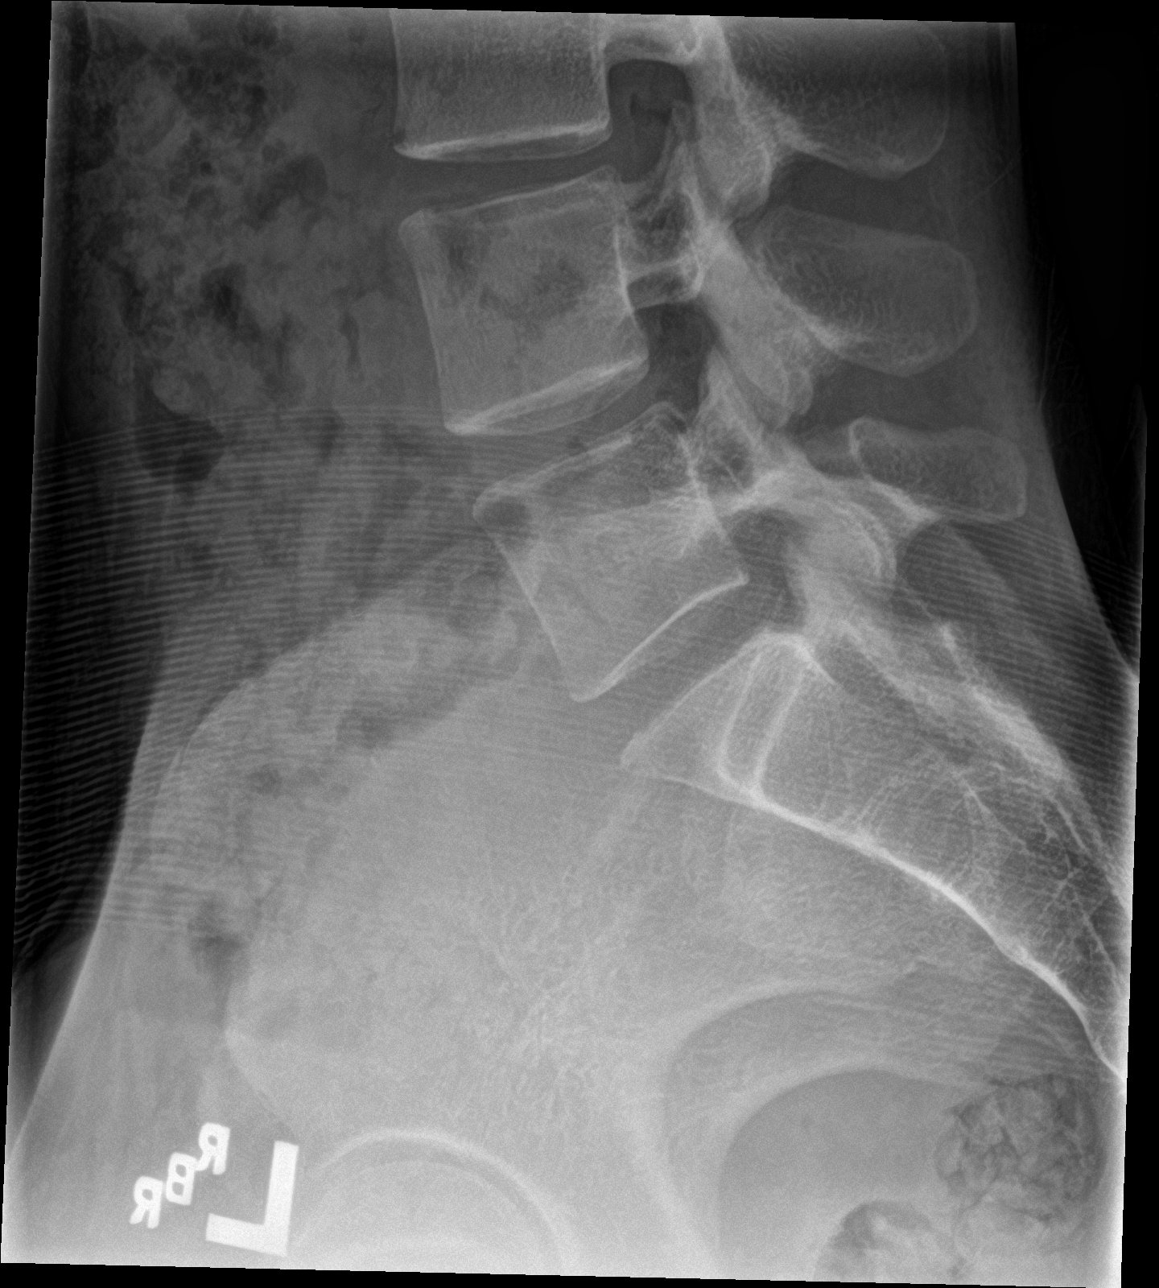

[5 of 5 positions shown; findings below may reference images not displayed]

FINDINGS: There is no evidence of lumbar spine fracture. Alignment is normal.
Intervertebral disc spaces are maintained.
IMPRESSION: No acute osseous injury of the lumbar spine.

## 2018-06-30 ENCOUNTER — Ambulatory Visit: Payer: BC Managed Care – PPO | Admitting: Family Medicine

## 2018-06-30 NOTE — Progress Notes (Deleted)
OFFICE VISIT  06/30/2018   CC: No chief complaint on file.    HPI:    Patient is a 27 y.o. African-American female who presents for cough. Pt reports onset of symptoms {x} days ago.  Primary symptoms are: {x}.  Most prominent/bothersome symptom(s): {x}.  Symptoms made worse by {x}.  Symptoms alleviated by {x}.   GI sx's: {yes/no} Prominent myalgias: {yes/no} Prominent fatigue: {yes/no}. Flu vaccine received this season at least 2 weeks ago: {yes/no}. Recent contact with person with flu-like illness: {yes/no}.  ROS: {x} no rash, no neck stiffness, no shortness of breath, no chest pain    Past Medical History:  Diagnosis Date  . Allergy   . Anemia    history of  . Anxiety   . Arthritis   . Asthma   . Chicken pox   . Colitis   . Depression    has been followed by psych in the past, Dr. Rolm Gala.   . Fibroids    had lupron injections, pt to have myomectomy  . GERD (gastroesophageal reflux disease)   . H. pylori infection 2013  . Lactose intolerance   . Migraines   . Salmonella     Past Surgical History:  Procedure Laterality Date  . ABLATION ON ENDOMETRIOSIS N/A 03/25/2016   Procedure: EXCISION AND ABLATION OF ENDOMETRIOSIS;  Surgeon: Governor Specking, MD;  Location: [facility];  Service: Gynecology;  Laterality: N/A;  . COLONOSCOPY W/ BIOPSIES  2017  . LAPAROSCOPIC GELPORT ASSISTED MYOMECTOMY N/A 03/25/2016   Procedure: LAPAROSCOPIC GELPORT ASSISTED MYOMECTOMY;  Surgeon: Governor Specking, MD;  Location: [facility];  Service: Gynecology;  Laterality: N/A;  . wisdom teeth extracted  2008, 2011    No outpatient medications prior to visit.   No facility-administered medications prior to visit.     Allergies  Allergen Reactions  . Prozac [Fluoxetine Hcl] Other (See Comments)    Suicidal thoughts  . Ibuprofen Other (See Comments)    Causes ulcers    ROS As per HPI  PE: There were no vitals taken for this visit. ***  LABS:    Chemistry      Component Value  Date/Time   NA 140 07/29/2016 1046   K 4.3 07/29/2016 1046   CL 109 07/29/2016 1046   CO2 23 07/29/2016 1046   BUN 13 07/29/2016 1046   CREATININE 0.77 07/29/2016 1046      Component Value Date/Time   CALCIUM 9.2 07/29/2016 1046   ALKPHOS 43 07/29/2016 1046   AST 15 07/29/2016 1046   ALT 8 07/29/2016 1046   BILITOT 0.9 07/29/2016 1046      IMPRESSION AND PLAN:  No problem-specific Assessment & Plan notes found for this encounter.   An After Visit Summary was printed and given to the patient.  FOLLOW UP: No follow-ups on file.  Signed:  Crissie Sickles, MD           06/30/2018

## 2018-07-01 ENCOUNTER — Encounter: Payer: Self-pay | Admitting: Family Medicine

## 2018-07-01 ENCOUNTER — Ambulatory Visit (INDEPENDENT_AMBULATORY_CARE_PROVIDER_SITE_OTHER): Payer: Managed Care, Other (non HMO) | Admitting: Family Medicine

## 2018-07-01 VITALS — BP 116/80 | HR 124 | Temp 99.8°F | Resp 16 | Ht 66.5 in | Wt 118.0 lb

## 2018-07-01 DIAGNOSIS — J101 Influenza due to other identified influenza virus with other respiratory manifestations: Secondary | ICD-10-CM

## 2018-07-01 DIAGNOSIS — R509 Fever, unspecified: Secondary | ICD-10-CM

## 2018-07-01 LAB — POCT INFLUENZA A/B
INFLUENZA A, POC: NEGATIVE
INFLUENZA B, POC: POSITIVE — AB

## 2018-07-01 MED ORDER — AZITHROMYCIN 250 MG PO TABS: ORAL_TABLET | ORAL | 0 refills | Status: DC

## 2018-07-01 MED ORDER — ONDANSETRON HCL 4 MG PO TABS: 4.0000 mg | ORAL_TABLET | Freq: Three times a day (TID) | ORAL | 0 refills | Status: DC | PRN

## 2018-07-01 NOTE — Patient Instructions (Signed)
Rest, hydrate. Hydrate, hydrate.  Tylenol or advil for fevers -aches and pains.  Take the zofran every 8 hours for two days and then as needed to help nausea and allow you to eat.  Start  flonase nasal spray, mucinex (DM if cough) Z-pack prescribed for you to start if productive cough or feeling worse after the weekend.  F/U 2 weeks of not improved. Go to ED if worsening and con not tolerate fluids or medicines.    Influenza, Adult Influenza is also called "the flu." It is an infection in the lungs, nose, and throat (respiratory tract). It is caused by a virus. The flu causes symptoms that are similar to symptoms of a cold. It also causes a high fever and body aches. The flu spreads easily from person to person (is contagious). Getting a flu shot (influenza vaccination) every year is the best way to prevent the flu. What are the causes? This condition is caused by the influenza virus. You can get the virus by:  Breathing in droplets that are in the air from the cough or sneeze of a person who has the virus.  Touching something that has the virus on it (is contaminated) and then touching your mouth, nose, or eyes. What increases the risk? Certain things may make you more likely to get the flu. These include:  Not washing your hands often.  Having close contact with many people during cold and flu season.  Touching your mouth, eyes, or nose without first washing your hands.  Not getting a flu shot every year. You may have a higher risk for the flu, along with serious problems such as a lung infection (pneumonia), if you:  Are older than 65.  Are pregnant.  Have a weakened disease-fighting system (immune system) because of a disease or taking certain medicines.  Have a long-term (chronic) illness, such as: ? Heart, kidney, or lung disease. ? Diabetes. ? Asthma.  Have a liver disorder.  Are very overweight (morbidly obese).  Have anemia. This is a condition that affects your  red blood cells. What are the signs or symptoms? Symptoms usually begin suddenly and last 4-14 days. They may include:  Fever and chills.  Headaches, body aches, or muscle aches.  Sore throat.  Cough.  Runny or stuffy (congested) nose.  Chest discomfort.  Not wanting to eat as much as normal (poor appetite).  Weakness or feeling tired (fatigue).  Dizziness.  Feeling sick to your stomach (nauseous) or throwing up (vomiting). How is this treated? If the flu is found early, you can be treated with medicine that can help reduce how bad the illness is and how long it lasts (antiviral medicine). This may be given by mouth (orally) or through an IV tube. Taking care of yourself at home can help your symptoms get better. Your doctor may suggest:  Taking over-the-counter medicines.  Drinking plenty of fluids. The flu often goes away on its own. If you have very bad symptoms or other problems, you may be treated in a hospital. Follow these instructions at home:     Activity  Rest as needed. Get plenty of sleep.  Stay home from work or school as told by your doctor. ? Do not leave home until you do not have a fever for 24 hours without taking medicine. ? Leave home only to visit your doctor. Eating and drinking  Take an ORS (oral rehydration solution). This is a drink that is sold at pharmacies and stores.  Drink  enough fluid to keep your pee (urine) pale yellow.  Drink clear fluids in small amounts as you are able. Clear fluids include: ? Water. ? Ice chips. ? Fruit juice that has water added (diluted fruit juice). ? Low-calorie sports drinks.  Eat bland, easy-to-digest foods in small amounts as you are able. These foods include: ? Bananas. ? Applesauce. ? Rice. ? Lean meats. ? Toast. ? Crackers.  Do not eat or drink: ? Fluids that have a lot of sugar or caffeine. ? Alcohol. ? Spicy or fatty foods. General instructions  Take over-the-counter and prescription  medicines only as told by your doctor.  Use a cool mist humidifier to add moisture to the air in your home. This can make it easier for you to breathe.  Cover your mouth and nose when you cough or sneeze.  Wash your hands with soap and water often, especially after you cough or sneeze. If you cannot use soap and water, use alcohol-based hand sanitizer.  Keep all follow-up visits as told by your doctor. This is important. How is this prevented?   Get a flu shot every year. You may get the flu shot in late summer, fall, or winter. Ask your doctor when you should get your flu shot.  Avoid contact with people who are sick during fall and winter (cold and flu season). Contact a doctor if:  You get new symptoms.  You have: ? Chest pain. ? Watery poop (diarrhea). ? A fever.  Your cough gets worse.  You start to have more mucus.  You feel sick to your stomach.  You throw up. Get help right away if you:  Have shortness of breath.  Have trouble breathing.  Have skin or nails that turn a bluish color.  Have very bad pain or stiffness in your neck.  Get a sudden headache.  Get sudden pain in your face or ear.  Cannot eat or drink without throwing up. Summary  Influenza ("the flu") is an infection in the lungs, nose, and throat. It is caused by a virus.  Take over-the-counter and prescription medicines only as told by your doctor.  Getting a flu shot every year is the best way to avoid getting the flu. This information is not intended to replace advice given to you by your health care provider. Make sure you discuss any questions you have with your health care provider. Document Released: 03/31/2008 Document Revised: 12/08/2017 Document Reviewed: 12/08/2017 Elsevier Interactive Patient Education  2019 [employer].

## 2018-07-01 NOTE — Progress Notes (Signed)
Grace Campbell , 15-Apr-1991, 27 y.o., female MRN: [id number] Patient Care Team    Relationship Specialty Notifications Start End  Ma Hillock, DO PCP - General Family Medicine  01/21/16   Milus Banister, MD Attending Physician Gastroenterology  01/21/16   Eldred Manges, MD Consulting Physician Obstetrics and Gynecology  06/01/16     Chief Complaint  Patient presents with  . Nasal Congestion    x4 days  . Cough  . Fever  . Neck Pain     Subjective: Pt presents for an OV with complaints of cough of 4 days duration.  Associated symptoms include nasal congestion, fever, neck pain (side), fatigue, muscle aches and nausea. She has not been eating well. She has tried to maintain hydration by drinking water and juice.  She did not get her flu shot this year.  Depression screen [facility] 2/9 01/25/2017 06/01/2016 01/21/2016 08/20/2015 08/11/2015  Decreased Interest 0 0 2 0 0  Down, Depressed, Hopeless 0 0 0 0 0  PHQ - 2 Score 0 0 2 0 0  Altered sleeping - - 2 - -  Tired, decreased energy - - 2 - -  Change in appetite - - 2 - -  Feeling bad or failure about yourself  - - 0 - -  Trouble concentrating - - 1 - -  Moving slowly or fidgety/restless - - 0 - -  Suicidal thoughts - - 0 - -  PHQ-9 Score - - 9 - -  Difficult doing work/chores - - Somewhat difficult - -    Allergies  Allergen Reactions  . Prozac [Fluoxetine Hcl] Other (See Comments)    Suicidal thoughts  . Ibuprofen Other (See Comments)    Causes ulcers   Social History   Tobacco Use  . Smoking status: Never Smoker  . Smokeless tobacco: Never Used  Substance Use Topics  . Alcohol use: Yes    Alcohol/week: 1.0 standard drinks    Types: 1 Glasses of wine per week   Past Medical History:  Diagnosis Date  . Allergy   . Anemia    history of  . Anxiety   . Arthritis   . Asthma   . Chicken pox   . Colitis   . Depression    has been followed by psych in the past, Dr. Rolm Gala.   . Fibroids    had lupron  injections, pt to have myomectomy  . GERD (gastroesophageal reflux disease)   . H. pylori infection 2013  . Lactose intolerance   . Migraines   . Salmonella    Past Surgical History:  Procedure Laterality Date  . ABLATION ON ENDOMETRIOSIS N/A 03/25/2016   Procedure: EXCISION AND ABLATION OF ENDOMETRIOSIS;  Surgeon: Governor Specking, MD;  Location: [facility];  Service: Gynecology;  Laterality: N/A;  . COLONOSCOPY W/ BIOPSIES  2017  . LAPAROSCOPIC GELPORT ASSISTED MYOMECTOMY N/A 03/25/2016   Procedure: LAPAROSCOPIC GELPORT ASSISTED MYOMECTOMY;  Surgeon: Governor Specking, MD;  Location: [facility];  Service: Gynecology;  Laterality: N/A;  . wisdom teeth extracted  2008, 2011   Family History  Problem Relation Age of Onset  . Colon cancer Maternal Aunt   . Alcohol abuse Maternal Grandfather   . Diabetes Paternal Grandmother   . Alcohol abuse Maternal Uncle   . Esophageal cancer Neg Hx   . Rectal cancer Neg Hx   . Stomach cancer Neg Hx    Allergies as of 07/01/2018      Reactions  Prozac [fluoxetine Hcl] Other (See Comments)   Suicidal thoughts   Ibuprofen Other (See Comments)   Causes ulcers      Medication List       Accurate as of July 01, 2018 11:53 AM. Always use your most recent med list.        azithromycin 250 MG tablet Commonly known as:  ZITHROMAX 500 mg day 1, then 250 mg QD   ondansetron 4 MG tablet Commonly known as:  ZOFRAN Take 1 tablet (4 mg total) by mouth every 8 (eight) hours as needed for nausea or vomiting.       All past medical history, surgical history, allergies, family history, immunizations andmedications were updated in the EMR today and reviewed under the history and medication portions of their EMR.     ROS: Negative, with the exception of above mentioned in HPI   Objective:  BP 116/80 (BP Location: Left Arm, Patient Position: Sitting, Cuff Size: Normal)   Pulse (!) 124   Temp 99.8 F (37.7 C) (Oral)   Resp 16   Ht 5' 6.5" (1.689 m)    Wt 118 lb (53.5 kg)   SpO2 95%   BMI 18.76 kg/m  Body mass index is 18.76 kg/m. Gen: febrile. No acute distress. Nontoxic in appearance, well developed, well nourished.  HENT: AT. [place]. Bilateral TM visualized with bilateral fullness, mild erythema MMM, no oral lesions. Bilateral nares with erythema and drainage. Throat without erythema or exudates. Cough present.  Eyes:Pupils Equal Round Reactive to light, Extraocular movements intact,  Conjunctiva without redness, discharge or icterus. Neck/lymp/endocrine: Supple,no lymphadenopathy CV: tachycardic.  Chest: CTAB, no wheeze or crackles. Good air movement, normal resp effort.  Abd: Soft. NTND. BS present. no Masses palpated. No rebound or guarding.  Skin: no rashes, purpura or petechiae.  Neuro:  Normal gait. PERLA. EOMi. Alert. Oriented x3  No exam data present No results found. Results for orders placed or performed in visit on 07/01/18 (from the past 24 hour(s))  POCT Influenza A/B     Status: Abnormal   Collection Time: 07/01/18 11:36 AM  Result Value Ref Range   Influenza A, POC Negative Negative   Influenza B, POC Positive (A) Negative    Assessment/Plan: Grace Campbell is a 27 y.o. female present for OV for  Fever and chills/Influenza B - POCT Influenza A/B - ondansetron (ZOFRAN) 4 MG tablet; Take 1 tablet (4 mg total) by mouth every 8 (eight) hours as needed for nausea or vomiting.  Dispense: 20 tablet; Refill: 0 - azithromycin (ZITHROMAX) 250 MG tablet; 500 mg day 1, then 250 mg QD  Dispense: 6 tablet; Refill: 0 Rest, hydrate. advil and tylenol, zofran for nausea.  + flonase, mucinex (DM if cough) azith  prescribed, take until completed if started- she was instructed to hold and use only if not improving- 2/2 to asthma history.  If cough present it can last up to 6-8 weeks.  F/U 2 weeks of not improved.    Reviewed expectations re: course of current medical issues.  Discussed self-management of symptoms.  Outlined  signs and symptoms indicating need for more acute intervention.  Patient verbalized understanding and all questions were answered.  Patient received an After-Visit Summary.    Orders Placed This Encounter  Procedures  . POCT Influenza A/B   > 25 minutes spent with patient, >50% of time spent face to face counseling     Note is dictated utilizing voice recognition software. Although note has been proof read  prior to signing, occasional typographical errors still can be missed. If any questions arise, please do not hesitate to call for verification.   electronically signed by:  Howard Pouch, DO  [place]

## 2018-10-08 LAB — BASIC METABOLIC PANEL
BUN: 9 (ref 4–21)
Creatinine: 0.7 (ref 0.5–1.1)
Potassium: 3.4 (ref 3.4–5.3)
Sodium: 140 (ref 137–147)

## 2018-10-08 LAB — HEPATIC FUNCTION PANEL
ALT: 17 (ref 7–35)
AST: 15 (ref 13–35)

## 2018-10-08 LAB — CBC AND DIFFERENTIAL
HCT: 36 (ref 36–46)
Hemoglobin: 11.9 — AB (ref 12.0–16.0)
Platelets: 271 (ref 150–399)
WBC: 5.2

## 2018-12-05 ENCOUNTER — Telehealth: Payer: Self-pay | Admitting: Family Medicine

## 2018-12-05 NOTE — Telephone Encounter (Signed)
Spoke with patient. This is an issue that has been going on since 2016/2017.  She states that she was offered an ultrasound back then for the issue but declined due to insurance.  Patient states that she now was a scan. Advised patient that she will need an appointment for updated office visit to order imaging.  Advised that we are doing virtual visits - patient is requesting to come in office for a morning appointment. She states that she knows I need to get approval for this, but she really does not want to do virtual - she is asking if Dr. Raoul Pitch will allow an in person visit.

## 2018-12-05 NOTE — Telephone Encounter (Signed)
[facility] for in person visit- 30 min. In person visits are [phone] for the morning.

## 2018-12-05 NOTE — Telephone Encounter (Signed)
Patient accepted an appointment for tomorrow at [facility] screening was done.

## 2018-12-05 NOTE — Telephone Encounter (Signed)
Pt is having pain from right arm pit all the down to her waist, in the front it is from arm pit to breast and in chest as well as neck on right side and upper back. She wants to come in for a scan and doesn't want pain management.   CB  [phone]

## 2018-12-06 ENCOUNTER — Other Ambulatory Visit: Payer: Self-pay

## 2018-12-06 ENCOUNTER — Encounter: Payer: Self-pay | Admitting: Family Medicine

## 2018-12-06 ENCOUNTER — Ambulatory Visit (INDEPENDENT_AMBULATORY_CARE_PROVIDER_SITE_OTHER): Payer: Managed Care, Other (non HMO) | Admitting: Family Medicine

## 2018-12-06 VITALS — BP 104/76 | HR 62 | Temp 97.6°F | Resp 17 | Ht 67.0 in | Wt 131.0 lb

## 2018-12-06 DIAGNOSIS — M25511 Pain in right shoulder: Secondary | ICD-10-CM

## 2018-12-06 DIAGNOSIS — G8929 Other chronic pain: Secondary | ICD-10-CM | POA: Diagnosis not present

## 2018-12-06 DIAGNOSIS — M546 Pain in thoracic spine: Secondary | ICD-10-CM | POA: Diagnosis not present

## 2018-12-06 MED ORDER — BACLOFEN 10 MG PO TABS: 10.0000 mg | ORAL_TABLET | Freq: Two times a day (BID) | ORAL | 0 refills | Status: DC

## 2018-12-06 NOTE — Patient Instructions (Signed)
Adhesions  Adhesions are strings of tissue that stick together. They are like scars, but they form inside your body and not on your skin. They can stick to organs and tissues and pull them out of place. They can also keep food and other things from moving through the body like normal. What are the causes? This condition is caused by irritation and swelling (inflammation) in the body. It can occur after:  Surgery.  An infection.  Treatment with high-energy rays (radiation).  Any other thing that causes irritation and swelling in the body. What increases the risk? This condition is more likely to develop in:  People who have had surgery in the belly (abdomen).  Women who have had more than one baby by C-section. What are the signs or symptoms? Symptoms of this condition include:  Pain in the belly or pelvis.  Bloating.  Trouble pooping (constipation).  Watery poop (diarrhea).  Throwing up (vomiting).  Pain during sex.  Finding it hard to get pregnant. In some people, there are no symptoms. How is this diagnosed? This condition is diagnosed based on:  Your symptoms.  Your medical history.  A physical exam.  Tests, such as an X-ray or CT scan.  Using a scope to check for adhesions in the body. How is this treated? This condition may be treated with:  Medicines.  Surgery. Follow these instructions at home:  Take over-the-counter and prescription medicines only as told by your doctor.  If you were prescribed an antibiotic medicine, take it as told by your doctor. Do not stop using the antibiotic even if you start to feel better.  Follow what your doctor tells you to eat or drink. You may be told to eat a liquid diet or a low-fiber diet.  Keep all follow-up visits as told by your doctor. This is important. Contact a doctor if:  You have a fever.  You have pain in your belly or pelvis.  You throw up.  You are bloated.  Your belly swells.  Your gut  (bowel) makes loud sounds.  You have trouble pooping. Get help right away if:  You have very bad pain in your belly or pelvis, and it is getting worse.  You keep throwing up.  You cannot pass gas.  You cannot poop. Summary  Adhesions are strings of tissue that stick together in the body.  They can stick to organs and tissues and pull them out of place.  Symptoms include pain, bloating, throwing up, and watery poop. You may also have trouble pooping, have pain during sex, or find it hard to get pregnant.  Treatment includes medicines and surgery.  Follow what your doctor tells you about what medicines to take, what to eat or drink, and when to call for help. This information is not intended to replace advice given to you by your health care provider. Make sure you discuss any questions you have with your health care provider. Document Released: 09/18/2008 Document Revised: 11/10/2017 Document Reviewed: 11/10/2017 Elsevier Interactive Patient Education  2019 [employer].
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
## 2018-12-09 ENCOUNTER — Encounter: Payer: Self-pay | Admitting: Family Medicine

## 2018-12-22 ENCOUNTER — Encounter: Payer: Self-pay | Admitting: Family Medicine
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
## 2018-12-26 ENCOUNTER — Ambulatory Visit (INDEPENDENT_AMBULATORY_CARE_PROVIDER_SITE_OTHER): Payer: Managed Care, Other (non HMO) | Admitting: Family Medicine

## 2018-12-26 ENCOUNTER — Other Ambulatory Visit (INDEPENDENT_AMBULATORY_CARE_PROVIDER_SITE_OTHER): Payer: Managed Care, Other (non HMO)

## 2018-12-26 ENCOUNTER — Encounter: Payer: Self-pay | Admitting: Family Medicine

## 2018-12-26 ENCOUNTER — Other Ambulatory Visit: Payer: Self-pay

## 2018-12-26 VITALS — BP 110/80 | HR 64 | Ht 67.0 in | Wt 132.0 lb

## 2018-12-26 DIAGNOSIS — M999 Biomechanical lesion, unspecified: Secondary | ICD-10-CM | POA: Diagnosis not present

## 2018-12-26 DIAGNOSIS — G8929 Other chronic pain: Secondary | ICD-10-CM | POA: Diagnosis not present

## 2018-12-26 DIAGNOSIS — M2550 Pain in unspecified joint: Secondary | ICD-10-CM

## 2018-12-26 DIAGNOSIS — M546 Pain in thoracic spine: Secondary | ICD-10-CM

## 2018-12-26 LAB — COMPREHENSIVE METABOLIC PANEL
ALT: 8 U/L (ref 0–35)
AST: 13 U/L (ref 0–37)
Albumin: 4.7 g/dL (ref 3.5–5.2)
Alkaline Phosphatase: 34 U/L — ABNORMAL LOW (ref 39–117)
BUN: 12 mg/dL (ref 6–23)
CO2: 22 mEq/L (ref 19–32)
Calcium: 9.2 mg/dL (ref 8.4–10.5)
Chloride: 103 mEq/L (ref 96–112)
Creatinine, Ser: 0.71 mg/dL (ref 0.40–1.20)
GFR: 118.93 mL/min (ref >60.00)
Glucose, Bld: 86 mg/dL (ref 70–99)
Potassium: 3.8 mEq/L (ref 3.5–5.1)
Sodium: 134 mEq/L — ABNORMAL LOW (ref 135–145)
Total Bilirubin: 1.5 mg/dL — ABNORMAL HIGH (ref 0.2–1.2)
Total Protein: 7.3 g/dL (ref 6.0–8.3)

## 2018-12-26 LAB — IBC PANEL
Iron: 102 ug/dL (ref 42–145)
Saturation Ratios: 30.9 % (ref 20.0–50.0)
Transferrin: 236 mg/dL (ref 212.0–360.0)

## 2018-12-26 LAB — C-REACTIVE PROTEIN: CRP: <1 mg/dL (ref 0.5–20.0)

## 2018-12-26 LAB — CBC WITH DIFFERENTIAL/PLATELET
Basophils Absolute: 0 10*3/uL (ref 0.0–0.1)
Basophils Relative: 0.9 % (ref 0.0–3.0)
Eosinophils Absolute: 0 10*3/uL (ref 0.0–0.7)
Eosinophils Relative: 1.2 % (ref 0.0–5.0)
HCT: 37.9 % (ref 36.0–46.0)
Hemoglobin: 12.7 g/dL (ref 12.0–15.0)
Lymphocytes Relative: 50.7 % — ABNORMAL HIGH (ref 12.0–46.0)
Lymphs Abs: 1.8 10*3/uL (ref 0.7–4.0)
MCHC: 33.6 g/dL (ref 30.0–36.0)
MCV: 97.1 fl (ref 78.0–100.0)
Monocytes Absolute: 0.3 10*3/uL (ref 0.1–1.0)
Monocytes Relative: 7 % (ref 3.0–12.0)
Neutro Abs: 1.4 10*3/uL (ref 1.4–7.7)
Neutrophils Relative %: 40.2 % — ABNORMAL LOW (ref 43.0–77.0)
Platelets: 270 10*3/uL (ref 150.0–400.0)
RBC: 3.9 Mil/uL (ref 3.87–5.11)
RDW: 13.5 % (ref 11.5–15.5)
WBC: 3.6 10*3/uL — ABNORMAL LOW (ref 4.0–10.5)

## 2018-12-26 LAB — URIC ACID: Uric Acid, Serum: 3.5 mg/dL (ref 2.4–7.0)

## 2018-12-26 LAB — SEDIMENTATION RATE: Sed Rate: 12 mm/hr (ref 0–20)

## 2018-12-26 LAB — FERRITIN: Ferritin: 30.2 ng/mL (ref 10.0–291.0)

## 2018-12-26 LAB — TSH: TSH: 0.89 u[IU]/mL (ref 0.35–4.50)

## 2018-12-26 NOTE — Assessment & Plan Note (Signed)
I think patient's right-sided thoracic back pain is multifactorial.  Patient did respond well to osteopathic manipulation.  Patient was declining different medications including oral anti-inflammatories at the moment.  Does have the muscle relaxer for breakthrough.  Discussed over-the-counter medications.  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  Patient does state there could be a family history of rheumatoid arthritis.  Patient will have this done, start with home exercise, supplements, return for further osteopathic manipulation pain is helpful in 4 weeks

## 2018-12-26 NOTE — Patient Instructions (Signed)
Good to see you Calcium pyruvate 1500mg  Tumeric 500mg  daily Exercise 3 times a week See me again in 3-6 weeks

## 2018-12-26 NOTE — Assessment & Plan Note (Signed)
Decision today to treat with OMT was based on Physical Exam  After verbal consent patient was treated with HVLA, ME, FPR techniques in cervical, thoracic, rib,  lumbar and sacral areas  Patient tolerated the procedure well with improvement in symptoms  Patient given exercises, stretches and lifestyle modifications  See medications in patient instructions if given  Patient will follow up in 4-8 weeks 

## 2018-12-28 LAB — RHEUMATOID FACTOR: Rheumatoid fact SerPl-aCnc: <14 IU/mL (ref <14)

## 2018-12-28 LAB — CALCIUM, IONIZED: Calcium, Ion: 5.11 mg/dL (ref 4.8–5.6)

## 2018-12-28 LAB — ANGIOTENSIN CONVERTING ENZYME: Angiotensin-Converting Enzyme: 18 U/L (ref 9–67)

## 2018-12-28 LAB — PTH, INTACT AND CALCIUM
Calcium: 10.9 mg/dL — ABNORMAL HIGH (ref 8.6–10.2)
PTH: 37 pg/mL (ref 14–64)

## 2018-12-28 LAB — ANA: Anti Nuclear Antibody (ANA): NEGATIVE

## 2018-12-28 LAB — VITAMIN D 1,25 DIHYDROXY
Vitamin D 1, 25 (OH)2 Total: 41 pg/mL (ref 18–72)
Vitamin D2 1, 25 (OH)2: <8 pg/mL
Vitamin D3 1, 25 (OH)2: 41 pg/mL

## 2018-12-28 LAB — CYCLIC CITRUL PEPTIDE ANTIBODY, IGG: Cyclic Citrullin Peptide Ab: <16 UNITS

## 2019-01-12 ENCOUNTER — Other Ambulatory Visit: Payer: Self-pay

## 2019-01-12 ENCOUNTER — Ambulatory Visit (INDEPENDENT_AMBULATORY_CARE_PROVIDER_SITE_OTHER): Payer: Managed Care, Other (non HMO) | Admitting: Family Medicine

## 2019-01-12 ENCOUNTER — Encounter: Payer: Self-pay | Admitting: Family Medicine

## 2019-01-12 VITALS — BP 116/72 | HR 82 | Ht 67.0 in | Wt 128.0 lb

## 2019-01-12 DIAGNOSIS — M546 Pain in thoracic spine: Secondary | ICD-10-CM

## 2019-01-12 DIAGNOSIS — M549 Dorsalgia, unspecified: Secondary | ICD-10-CM | POA: Diagnosis not present

## 2019-01-12 DIAGNOSIS — M25511 Pain in right shoulder: Secondary | ICD-10-CM

## 2019-01-12 DIAGNOSIS — M999 Biomechanical lesion, unspecified: Secondary | ICD-10-CM | POA: Diagnosis not present

## 2019-01-12 DIAGNOSIS — G8929 Other chronic pain: Secondary | ICD-10-CM

## 2019-01-12 NOTE — Assessment & Plan Note (Signed)
Responding well to manipulation.  Discussed icing regimen and home exercises.  Tried trigger point injections as well.  Patient will be moving and will give the name of another provider in the near future.

## 2019-01-12 NOTE — Patient Instructions (Addendum)
Good to see you Duexis 1 pill 3 times a day for 3 days

## 2019-01-12 NOTE — Progress Notes (Signed)
[facility] [address], [place] [address] Phone: [phone] Subjective:   I, Grace Campbell, am serving as a scribe for Dr. Hulan Saas.    CC: Back pain follow-up  OXB:[id number]   12/26/2018: I think patient's right-sided thoracic back pain is multifactorial.  Patient did respond well to osteopathic manipulation.  Patient was declining different medications including oral anti-inflammatories at the moment.  Does have the muscle relaxer for breakthrough.  Discussed over-the-counter medications.  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  Patient does state there could be a family history of rheumatoid arthritis.  Patient will have this done, start with home exercise, supplements, return for further osteopathic manipulation pain is helpful in 4 weeks  Update 01/12/2019: Grace Campbell is a 28 y.o. female coming in with complaint of back pain. States she was doing well for a while but the cramp on her right side is stronger. Some of the exercises are difficult. Also massages her side. Sometimes she feels nauseous and hot when the pain comes on (last night). Wants pain relief options for her 12 hour drive. States that last visit an injection was mentioned and wants to know more information.  Patient denies any shortness of breath, patient denies any swelling in lower extremities.       Past Medical History:  Diagnosis Date  . Allergy   . Anemia    history of  . Anxiety   . Arthritis   . Asthma   . Chicken pox   . Colitis   . Depression    has been followed by psych in the past, Dr. Rolm Gala.   . Fibroids    had lupron injections, pt to have myomectomy  . GERD (gastroesophageal reflux disease)   . H. pylori infection 2013  . Lactose intolerance   . Migraines   . Salmonella    Past Surgical History:  Procedure Laterality Date  . ABLATION ON ENDOMETRIOSIS N/A  03/25/2016   Procedure: EXCISION AND ABLATION OF ENDOMETRIOSIS;  Surgeon: Governor Specking, MD;  Location: [facility];  Service: Gynecology;  Laterality: N/A;  . COLONOSCOPY W/ BIOPSIES  2017  . LAPAROSCOPIC GELPORT ASSISTED MYOMECTOMY N/A 03/25/2016   Procedure: LAPAROSCOPIC GELPORT ASSISTED MYOMECTOMY;  Surgeon: Governor Specking, MD;  Location: [facility];  Service: Gynecology;  Laterality: N/A;  . wisdom teeth extracted  2008, 2011   Social History   Socioeconomic History  . Marital status: Single    Spouse name: Not on file  . Number of children: Not on file  . Years of education: Not on file  . Highest education level: Not on file  Occupational History  . Not on file  Social Needs  . Financial resource strain: Not on file  . Food insecurity    Worry: Not on file    Inability: Not on file  . Transportation needs    Medical: Not on file    Non-medical: Not on file  Tobacco Use  . Smoking status: Never Smoker  . Smokeless tobacco: Never Used  Substance and Sexual Activity  . Alcohol use: Yes    Alcohol/week: 1.0 standard drinks    Types: 1 Glasses of wine per week  . Drug use: No  . Sexual activity: Never    Partners: Male    Comment: No sex past 4-5 months  Lifestyle  . Physical activity    Days per week:  Not on file    Minutes per session: Not on file  . Stress: Not on file  Relationships  . Social Herbalist on phone: Not on file    Gets together: Not on file    Attends religious service: Not on file    Active member of club or organization: Not on file    Attends meetings of clubs or organizations: Not on file    Relationship status: Not on file  Other Topics Concern  . Not on file  Social History Narrative   Marital status: single; not dating.      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       Lives: with parents.       Tobacco; none      Alcohol: socially       Drugs: none      Exercise: none   Drinks caffeine   Wears her seatbelt.   Does not eat red meat or dairy products.    Smoke detector in the home   Feels safe in her relationships.    Allergies  Allergen Reactions  . Prozac [Fluoxetine Hcl] Other (See Comments)    Suicidal thoughts  . Ibuprofen Other (See Comments)    Causes ulcers GI UPSET    Family History  Problem Relation Age of Onset  . Colon cancer Maternal Aunt   . Alcohol abuse Maternal Grandfather   . Diabetes Paternal Grandmother   . Alcohol abuse Maternal Uncle   . Esophageal cancer Neg Hx   . Rectal cancer Neg Hx   . Stomach cancer Neg Hx          Current Outpatient Medications (Other):  .  baclofen (LIORESAL) 10 MG tablet, Take 1 tablet (10 mg total) by mouth 2 (two) times daily.    Past medical history, social, surgical and family history all reviewed in electronic medical record.  No pertanent information unless stated regarding to the chief complaint.   Review of Systems:  No headache, visual changes, nausea, vomiting, diarrhea, constipation, dizziness, abdominal pain, skin rash, fevers, chills, night sweats, weight loss, swollen lymph nodes,  chest pain, shortness of breath, mood changes.  Muscle aches, body aches  Objective  Blood pressure 116/72, pulse 82, height 5\' 7"  (1.702 m), weight 128 lb (58.1 kg), SpO2 99 %.    General: No apparent distress alert and oriented x3 mood and affect normal, dressed appropriately.  HEENT: Pupils equal, extraocular movements intact  Respiratory: Patient's speak in full sentences and does not appear short of breath  Cardiovascular: No lower extremity edema, non tender, no erythema  Skin: Warm dry intact with no signs of infection or rash on extremities or on axial skeleton.  Abdomen: Soft nontender  Neuro: Cranial nerves II through XII are intact, neurovascularly intact in all extremities with 2+ DTRs and 2+ pulses.  Lymph: No lymphadenopathy of posterior or  anterior cervical chain or axillae bilaterally.  Gait normal with good balance and coordination.  MSK:  Non tender with full range of motion and good stability and symmetric strength and tone of shoulders, elbows, wrist, hip, knee and ankles bilaterally.  Thoracic spine back exam shows the patient does have multiple trigger points in the right shoulder region.  3 distinct points.  Tightness in the paraspinal musculature mostly around the parascapular region.  After verbal consent patient was prepped with alcohol swabs and with  a 25-gauge 3 inch needle injected with a total of 3 cc of 0.5% Marcaine and 1 cc of Kenalog 40 mg/mL.  3 distinct trigger points no blood loss.  Band-Aids placed.  Postinjection instructions given.     Impression and Recommendations:     This case required medical decision making of moderate complexity. The above documentation has been reviewed and is accurate and complete Lyndal Pulley, DO       Note: This dictation was prepared with Dragon dictation along with smaller phrase technology. Any transcriptional errors that result from this process are unintentional.

## 2019-01-12 NOTE — Assessment & Plan Note (Signed)
Decision today to treat with OMT was based on Physical Exam  After verbal consent patient was treated with HVLA, ME, FPR techniques in cervical, thoracic, rib, lumbar and sacral areas  Patient tolerated the procedure well with improvement in symptoms  Patient given exercises, stretches and lifestyle modifications  See medications in patient instructions if given  Patient will follow up in 4 weeks 

## 2019-01-12 NOTE — Assessment & Plan Note (Signed)
Patient given injection.  Tolerated the procedure well.  Discussed icing regimen and home exercise.  Discussed posture and ergonomics.  Patient was recommended changes where she can ergonomically.  Will begin 4 to 8 weeks if she is in town otherwise as needed.

## 2019-09-11 ENCOUNTER — Inpatient Hospital Stay (HOSPITAL_COMMUNITY)
Admission: AD | Admit: 2019-09-11 | Discharge: 2019-09-12 | Disposition: A | Discharge status: Home / Self Care | Payer: Managed Care, Other (non HMO) | Attending: Obstetrics & Gynecology | Admitting: Obstetrics & Gynecology

## 2019-09-11 ENCOUNTER — Encounter (HOSPITAL_COMMUNITY): Payer: Self-pay | Admitting: *Deleted

## 2019-09-11 DIAGNOSIS — Z3A30 30 weeks gestation of pregnancy: Secondary | ICD-10-CM | POA: Insufficient documentation

## 2019-09-11 DIAGNOSIS — O4700 False labor before 37 completed weeks of gestation, unspecified trimester: Secondary | ICD-10-CM

## 2019-09-11 DIAGNOSIS — Z886 Allergy status to analgesic agent status: Secondary | ICD-10-CM | POA: Insufficient documentation

## 2019-09-11 DIAGNOSIS — O479 False labor, unspecified: Secondary | ICD-10-CM

## 2019-09-11 MED ORDER — LACTATED RINGERS IV BOLUS
1000.0000 mL | Freq: Once | INTRAVENOUS | Status: AC
  Administered 2019-09-12: 1000 mL via INTRAVENOUS

## 2019-09-11 NOTE — MAU Note (Signed)
Pt reports she has had ctx since 7p. Gotten closer together about 4-7 min apart. Denies an bleeding reports some white mucusy discharge. Good fetal movement reported.

## 2019-09-12 DIAGNOSIS — Z3A30 30 weeks gestation of pregnancy: Secondary | ICD-10-CM | POA: Diagnosis not present

## 2019-09-12 DIAGNOSIS — O4703 False labor before 37 completed weeks of gestation, third trimester: Secondary | ICD-10-CM

## 2019-09-12 DIAGNOSIS — Z886 Allergy status to analgesic agent status: Secondary | ICD-10-CM | POA: Diagnosis not present

## 2019-09-12 LAB — URINALYSIS, ROUTINE W REFLEX MICROSCOPIC
Bilirubin Urine: NEGATIVE
Glucose, UA: NEGATIVE mg/dL
Hgb urine dipstick: NEGATIVE
Ketones, ur: NEGATIVE mg/dL
Leukocytes,Ua: NEGATIVE
Nitrite: NEGATIVE
Protein, ur: NEGATIVE mg/dL
Specific Gravity, Urine: 1.005 (ref 1.005–1.030)
pH: 7 (ref 5.0–8.0)

## 2019-09-12 MED ORDER — CYCLOBENZAPRINE HCL 10 MG PO TABS: 10.0000 mg | ORAL_TABLET | Freq: Two times a day (BID) | ORAL | 0 refills | Status: DC | PRN

## 2019-09-12 MED ORDER — CYCLOBENZAPRINE HCL 5 MG PO TABS
10.0000 mg | ORAL_TABLET | Freq: Once | ORAL | Status: AC
  Administered 2019-09-12: 10 mg via ORAL
  Filled 2019-09-12: qty 2

## 2019-09-12 MED ORDER — NIFEDIPINE 10 MG PO CAPS
10.0000 mg | ORAL_CAPSULE | ORAL | Status: AC | PRN
  Administered 2019-09-12 (×3): 10 mg via ORAL
  Filled 2019-09-12 (×3): qty 1

## 2019-09-12 NOTE — Discharge Instructions (Signed)

## 2019-09-12 NOTE — MAU Provider Note (Signed)
History     [name]: [id number]  Arrival date and time: 09/11/19 2310   First Provider Initiated Contact with Patient 09/11/19 2351      Chief Complaint  Patient presents with  . Contractions   HPI Grace Campbell is a 29 y.o. G4P0020 at [redacted]w[redacted]d who presents to MAU with chief complaint of recurrent preterm contractions. This is a new problem, onset on 09/11/2019 at 1900 hours. Patient states her pain has worsened in frequency and intensity since that time. She denies vaginal bleeding, leaking of fluid, decreased fetal movement, fever, falls, or recent illness. She is remote from intercourse.  She receives [facility] and her next appointment is 10/13/2019.  OB History    Gravida  4   Para      Term      Preterm      AB  2   Living        SAB  1   TAB  1   Ectopic      Multiple      Live Births              Past Medical History:  Diagnosis Date  . Allergy   . Anemia    history of  . Anxiety   . Arthritis   . Asthma   . Chicken pox   . Colitis   . Depression    has been followed by psych in the past, Dr. Rolm Gala.   . Fibroids    had lupron injections, pt to have myomectomy  . GERD (gastroesophageal reflux disease)   . H. pylori infection 2013  . Lactose intolerance   . Migraines   . Salmonella     Past Surgical History:  Procedure Laterality Date  . ABLATION ON ENDOMETRIOSIS N/A 03/25/2016   Procedure: EXCISION AND ABLATION OF ENDOMETRIOSIS;  Surgeon: Governor Specking, MD;  Location: [facility];  Service: Gynecology;  Laterality: N/A;  . COLONOSCOPY W/ BIOPSIES  2017  . LAPAROSCOPIC GELPORT ASSISTED MYOMECTOMY N/A 03/25/2016   Procedure: LAPAROSCOPIC GELPORT ASSISTED MYOMECTOMY;  Surgeon: Governor Specking, MD;  Location: [facility];  Service: Gynecology;  Laterality: N/A;  . wisdom teeth extracted  2008, 2011    Family History  Problem Relation Age of Onset  . Colon cancer Maternal Aunt   . Alcohol abuse Maternal Grandfather   .  Diabetes Paternal Grandmother   . Alcohol abuse Maternal Uncle   . Esophageal cancer Neg Hx   . Rectal cancer Neg Hx   . Stomach cancer Neg Hx     Social History   Tobacco Use  . Smoking status: Never Smoker  . Smokeless tobacco: Never Used  Substance Use Topics  . Alcohol use: Yes    Alcohol/week: 1.0 standard drinks    Types: 1 Glasses of wine per week  . Drug use: No    Allergies:  Allergies  Allergen Reactions  . Prozac [Fluoxetine Hcl] Other (See Comments)    Suicidal thoughts  . Ibuprofen Other (See Comments)    Causes ulcers GI UPSET   . Latex     Medications Prior to Admission  Medication Sig Dispense Refill Last Dose  . Prenatal Vit-Fe Fumarate-FA (MULTIVITAMIN-PRENATAL) 27-0.8 MG TABS tablet Take 1 tablet by mouth daily at 12 noon.   09/11/2019 at Unknown time  . baclofen (LIORESAL) 10 MG tablet Take 1 tablet (10 mg total) by mouth 2 (two) times daily. 60 each 0     Review of Systems  Constitutional: Negative for chills, fatigue and fever.  Gastrointestinal: Positive for abdominal pain.  Genitourinary: Negative for dysuria, vaginal bleeding and vaginal discharge.  Musculoskeletal: Positive for back pain.  All other systems reviewed and are negative.  Physical Exam   Blood pressure 122/80, pulse 83, temperature 98.5 F (36.9 C), resp. rate 18, height 5' 5.5" (1.664 m), weight 73.9 kg, last menstrual period 12/03/2018.  Physical Exam  Nursing note and vitals reviewed. Constitutional: She is oriented to person, place, and time. She appears well-developed and well-nourished.  Cardiovascular: Normal rate.  Respiratory: Effort normal and breath sounds normal. She has no decreased breath sounds.  GI: Soft.  Gravid  Genitourinary:    Vagina and uterus normal.     No vaginal discharge.   Musculoskeletal:        General: Normal range of motion.  Neurological: She is alert and oriented to person, place, and time.  Skin: Skin is warm and dry.  Psychiatric:  She has a normal mood and affect. Her behavior is normal. Judgment and thought content normal.    MAU Course  Procedures  2345 Initial assessment Cervix 0/thick/posterior  0105 --Returned to bedside --Patient s/p Flexeril one hour ago, now rates pain as 3-4/10  0140 --Patient verbalizes to [employer] that her pain score is now 0/10  --Reactive tracing: baseline 140, mod var, positive 15 x 15 accels, no decels --Toco: irregular ctx resolving with IV fluids bolus and Procardia  Orders Placed This Encounter  Procedures  . Urinalysis, Routine w reflex microscopic  . Insert peripheral IV  . Discharge patient   Meds ordered this encounter  Medications  . lactated ringers bolus 1,000 mL  . NIFEdipine (PROCARDIA) capsule 10 mg  . cyclobenzaprine (FLEXERIL) tablet 10 mg  . cyclobenzaprine (FLEXERIL) 10 MG tablet    Sig: Take 1 tablet (10 mg total) by mouth 2 (two) times daily as needed for muscle spasms.    Dispense:  20 tablet    Refill:  0    Order Specific Question:   Supervising Provider    Answer:   Florian Buff [[id number]]   Patient Vitals for the past 24 hrs:  BP Temp Pulse Resp Height Weight  09/12/19 0238 120/70 - 92 - - -  09/12/19 0056 110/71 - - - - -  09/12/19 0035 121/83 - - - - -  09/12/19 0011 123/79 - - - - -  09/11/19 2349 122/80 - 83 - - -  09/11/19 2345 131/88 - 84 - - -  09/11/19 2326 123/80 98.5 F (36.9 C) (!) 102 18 5' 5.5" (1.664 m) 73.9 kg   Results for orders placed or performed during the hospital encounter of 09/11/19 (from the past 24 hour(s))  Urinalysis, Routine w reflex microscopic     Status: Abnormal   Collection Time: 09/11/19 11:47 PM  Result Value Ref Range   Color, Urine STRAW (A) YELLOW   APPearance CLEAR CLEAR   Specific Gravity, Urine 1.005 1.005 - 1.030   pH 7.0 5.0 - 8.0   Glucose, UA NEGATIVE NEGATIVE mg/dL   Hgb urine dipstick NEGATIVE NEGATIVE   Bilirubin Urine NEGATIVE NEGATIVE   Ketones, ur NEGATIVE NEGATIVE mg/dL    Protein, ur NEGATIVE NEGATIVE mg/dL   Nitrite NEGATIVE NEGATIVE   Leukocytes,Ua NEGATIVE NEGATIVE   Assessment and Plan  --29 y.o. G4P0020 at [redacted]w[redacted]d  --S/p three hours continuous monitoring, cervix remains closed --Patient with pain score 0/10 for final hour prior to discharge --Discharge home in stable  condition  F/U: --Next appt [facility] 10/13/2019  Darlina Rumpf, CNM 09/12/2019, 5:28 AM

## 2019-10-30 ENCOUNTER — Inpatient Hospital Stay (HOSPITAL_COMMUNITY): Admit: 2019-10-30 | Payer: Managed Care, Other (non HMO) | Admitting: Obstetrics and Gynecology

## 2019-10-30 ENCOUNTER — Encounter (HOSPITAL_COMMUNITY): Payer: Self-pay

## 2019-10-30 SURGERY — Surgical Case
Anesthesia: Regional

## 2020-07-17 ENCOUNTER — Other Ambulatory Visit: Payer: Self-pay

## 2020-07-17 ENCOUNTER — Ambulatory Visit
Admission: EM | Admit: 2020-07-17 | Discharge: 2020-07-17 | Disposition: A | Discharge status: Home / Self Care | Payer: Managed Care, Other (non HMO) | Attending: Emergency Medicine | Admitting: Emergency Medicine

## 2020-07-17 DIAGNOSIS — Z20822 Contact with and (suspected) exposure to covid-19: Secondary | ICD-10-CM | POA: Diagnosis not present

## 2020-07-17 DIAGNOSIS — J069 Acute upper respiratory infection, unspecified: Secondary | ICD-10-CM

## 2020-07-17 MED ORDER — FLUTICASONE PROPIONATE 50 MCG/ACT NA SUSP: 1.0000 | Freq: Every day | NASAL | 0 refills | Status: AC

## 2020-07-17 NOTE — ED Provider Notes (Signed)
[name]    [name]: [id number] Arrival date & time: 07/17/20  1520      History   Chief Complaint Chief Complaint  Patient presents with  . Cough    HPI Grace Campbell is a 30 y.o. female presenting today for evaluation of URI symptoms.  Reports that she has had nasal congestion and associated body aches.  Reports symptoms began approximately 2 to 3 days ago.  Has had cough, congestion, fevers and body aches.  Reports COVID exposure in the household.  Patient is breast-feeding.  HPI  Past Medical History:  Diagnosis Date  . Allergy   . Anemia    history of  . Anxiety   . Arthritis   . Asthma   . Chicken pox   . Colitis   . Depression    has been followed by psych in the past, Dr. Rolm Gala.   . Fibroids    had lupron injections, pt to have myomectomy  . GERD (gastroesophageal reflux disease)   . H. pylori infection 2013  . Lactose intolerance   . Migraines   . Salmonella     Patient Active Problem List   Diagnosis Date Noted  . Nonallopathic lesion of thoracic region 12/26/2018  . Nonallopathic lesion of rib cage 12/26/2018  . Nonallopathic lesion of cervical region 12/26/2018  . Nonallopathic lesion of sacral region 12/26/2018  . Nonallopathic lesion of lumbosacral region 12/26/2018  . Trigger point of shoulder region, right 12/06/2018  . Trigger point of thoracic region 12/06/2018  . Chronic right-sided thoracic back pain 12/06/2018  . Gastroesophageal reflux disease without esophagitis 07/29/2016    Past Surgical History:  Procedure Laterality Date  . ABLATION ON ENDOMETRIOSIS N/A 03/25/2016   Procedure: EXCISION AND ABLATION OF ENDOMETRIOSIS;  Surgeon: Governor Specking, MD;  Location: [facility];  Service: Gynecology;  Laterality: N/A;  . COLONOSCOPY W/ BIOPSIES  2017  . LAPAROSCOPIC GELPORT ASSISTED MYOMECTOMY N/A 03/25/2016   Procedure: LAPAROSCOPIC GELPORT ASSISTED MYOMECTOMY;  Surgeon: Governor Specking, MD;  Location: [facility];  Service:  Gynecology;  Laterality: N/A;  . wisdom teeth extracted  2008, 2011    OB History    Gravida  4   Para      Term      Preterm      AB  2   Living        SAB  1   IAB  1   Ectopic      Multiple      Live Births               Home Medications    Prior to Admission medications   Medication Sig Start Date End Date Taking? Authorizing Provider  fluticasone (FLONASE) 50 MCG/ACT nasal spray Place 1-2 sprays into both nostrils daily. 07/17/20  Yes Caydon Feasel, Elesa Hacker, PA-C    Family History Family History  Problem Relation Age of Onset  . Colon cancer Maternal Aunt   . Alcohol abuse Maternal Grandfather   . Diabetes Paternal Grandmother   . Alcohol abuse Maternal Uncle   . Esophageal cancer Neg Hx   . Rectal cancer Neg Hx   . Stomach cancer Neg Hx     Social History Social History   Tobacco Use  . Smoking status: Never Smoker  . Smokeless tobacco: Never Used  Vaping Use  . Vaping Use: Never used  Substance Use Topics  . Alcohol use: Yes    Alcohol/week: 1.0 standard drink  Types: 1 Glasses of wine per week  . Drug use: No     Allergies   Prozac [fluoxetine hcl], Ibuprofen, and Latex   Review of Systems Review of Systems  Constitutional: Positive for fever. Negative for activity change, appetite change, chills and fatigue.  HENT: Positive for congestion, rhinorrhea, sinus pressure and sore throat. Negative for ear pain and trouble swallowing.   Eyes: Negative for discharge and redness.  Respiratory: Positive for cough. Negative for chest tightness and shortness of breath.   Cardiovascular: Negative for chest pain.  Gastrointestinal: Negative for abdominal pain, diarrhea, nausea and vomiting.  Musculoskeletal: Positive for myalgias.  Skin: Negative for rash.  Neurological: Positive for headaches. Negative for dizziness and light-headedness.     Physical Exam Triage Vital Signs ED Triage Vitals  Enc Vitals Group     BP      Pulse       Resp      Temp      Temp src      SpO2      Weight      Height      Head Circumference      Peak Flow      Pain Score      Pain Loc      Pain Edu?      Excl. in [place]?    No data found.  Updated Vital Signs BP 125/79 (BP Location: Right Arm)   Pulse (!) 122   Temp (!) 101 F (38.3 C) (Oral) Comment: last tylenol @ 1:30pm  Resp 18   LMP 07/08/2020   SpO2 97%   Breastfeeding Yes   Visual Acuity Right Eye Distance:   Left Eye Distance:   Bilateral Distance:    Right Eye Near:   Left Eye Near:    Bilateral Near:     Physical Exam Vitals and nursing note reviewed.  Constitutional:      Appearance: She is well-developed and well-nourished.     Comments: No acute distress  HENT:     Head: Normocephalic and atraumatic.     Ears:     Comments: Bilateral ears without tenderness to palpation of external auricle, tragus and mastoid, EAC's without erythema or swelling, TM's with good bony landmarks and cone of light. Non erythematous.     Nose: Nose normal.     Mouth/Throat:     Comments: Oral mucosa pink and moist, no tonsillar enlargement or exudate. Posterior pharynx patent and nonerythematous, no uvula deviation or swelling. Normal phonation. Eyes:     Conjunctiva/sclera: Conjunctivae normal.  Cardiovascular:     Rate and Rhythm: Normal rate.  Pulmonary:     Effort: Pulmonary effort is normal. No respiratory distress.     Comments: Breathing comfortably at rest, CTABL, no wheezing, rales or other adventitious sounds auscultated Abdominal:     General: There is no distension.  Musculoskeletal:        General: Normal range of motion.     Cervical back: Neck supple.  Skin:    General: Skin is warm and dry.  Neurological:     Mental Status: She is alert and oriented to person, place, and time.  Psychiatric:        Mood and Affect: Mood and affect normal.      UC Treatments / Results  Labs (all labs ordered are listed, but only abnormal results are  displayed) Labs Reviewed  COVID-19, FLU A+B NAA    EKG   Radiology No results found.  Procedures Procedures (including critical care time)  Medications Ordered in UC Medications - No data to display  Initial Impression / Assessment and Plan / UC Course  I have reviewed the triage vital signs and the nursing notes.  Pertinent labs & imaging results that were available during my care of the patient were reviewed by me and considered in my medical decision making (see chart for details).     Viral URI with cough-fever in clinic today, suspicious of COVID versus flu, recommend symptomatic and supportive care rest and fluids.  Continue to monitor,Discussed strict return precautions. Patient verbalized understanding and is agreeable with plan.  Final Clinical Impressions(s) / UC Diagnoses   Final diagnoses:  Encounter for screening laboratory testing for COVID-19 virus  Viral URI with cough     Discharge Instructions     COVID/flu test pending, monitor MyChart for results Tylenol and ibuprofen for fever body aches headaches Rest and fluids Flonase nasal spray for nasal congestion Honey and hot tea for cough/throat irritation Follow-up if not improving or worsening    ED Prescriptions    Medication Sig Dispense Auth. Provider   fluticasone (FLONASE) 50 MCG/ACT nasal spray Place 1-2 sprays into both nostrils daily. 16 g Kieron Kantner, Holcomb C, PA-C     PDMP not reviewed this encounter.   Janith Lima, [place] 07/17/20 1633

## 2020-07-17 NOTE — ED Triage Notes (Signed)
Pt c/o cough since Monday and developed congestion yesterday, fever and body aches today.

## 2020-07-17 NOTE — Discharge Instructions (Signed)
COVID/flu test pending, monitor MyChart for results Tylenol and ibuprofen for fever body aches headaches Rest and fluids Flonase nasal spray for nasal congestion Honey and hot tea for cough/throat irritation Follow-up if not improving or worsening

## 2020-07-19 LAB — COVID-19, FLU A+B NAA
Influenza A, NAA: NOT DETECTED
Influenza B, NAA: NOT DETECTED
SARS-CoV-2, NAA: DETECTED — AB

## 2020-11-14 ENCOUNTER — Other Ambulatory Visit: Payer: Self-pay

## 2020-11-14 ENCOUNTER — Ambulatory Visit: Payer: No Typology Code available for payment source | Attending: Family Medicine | Admitting: Family Medicine

## 2020-11-14 ENCOUNTER — Encounter: Payer: Self-pay | Admitting: Family Medicine

## 2020-11-14 DIAGNOSIS — Z3491 Encounter for supervision of normal pregnancy, unspecified, first trimester: Secondary | ICD-10-CM

## 2020-11-14 NOTE — Progress Notes (Signed)
   Virtual Visit via Telephone Note  I connected with Grace Campbell, on 11/14/2020 at 8:51 AM by telephone due to the COVID-19 pandemic and verified that I am speaking with the correct person using two identifiers.   Consent: I discussed the limitations, risks, security and privacy concerns of performing an evaluation and management service by telephone and the availability of in person appointments. I also discussed with the patient that there may be a patient responsible charge related to this service. The patient expressed understanding and agreed to proceed.   Location of Patient: Environmental education officer of Provider: Clinic   Persons participating in Telemedicine visit: Craige Cotta Dr. Margarita Rana     History of Present Illness: Grace Campbell is a 30 year old female with a history of asthma, allergic rhinitis who presents to establish care and currently does not have a PCP.  She has been back and forth between [place], [place]. Her LMP was 09/22/20 and she was at [facility] for Obstetric care but was not comfortable at her visit as she felt to be not familiar with a new ultrasound machine and could not provide an accurate dating of her pregnancy.  She would like a second opinion. She takes prenatal vitamins. Denies acute concerns today.  Past Medical History:  Diagnosis Date  . Allergy   . Anemia    history of  . Anxiety   . Arthritis   . Asthma   . Chicken pox   . Colitis   . Depression    has been followed by psych in the past, Dr. Rolm Gala.   . Fibroids    had lupron injections, pt to have myomectomy  . GERD (gastroesophageal reflux disease)   . H. pylori infection 2013  . Lactose intolerance   . Migraines   . Salmonella    Allergies  Allergen Reactions  . Prozac [Fluoxetine Hcl] Other (See Comments)    Suicidal thoughts  . Ibuprofen Other (See Comments)    Causes ulcers GI UPSET   . Latex     Current Outpatient Medications on File Prior to  Visit  Medication Sig Dispense Refill  . fluticasone (FLONASE) 50 MCG/ACT nasal spray Place 1-2 sprays into both nostrils daily. 16 g 0   No current facility-administered medications on file prior to visit.    ROS: See HPI  Observations/Objective: Awake, alert, and 2x3 Not in acute distress  Assessment and Plan: 1. First trimester pregnancy Normal first trimester pregnancy Will need ultrasound Continue with prenatal vitamin Advised that if she does not hear from OB/GYN in the next 1 to 2 weeks she could send [place] a message to check up on her referral - Ambulatory referral to Gynecology   Follow Up Instructions: She will follow-up with her OB/GYN and return to primary care after delivery   I discussed the assessment and treatment plan with the patient. The patient was provided an opportunity to ask questions and all were answered. The patient agreed with the plan and demonstrated an understanding of the instructions.   The patient was advised to call back or seek an in-person evaluation if the symptoms worsen or if the condition fails to improve as anticipated.     I provided 8 minutes total of non-face-to-face time during this encounter.   Charlott Rakes, MD, FAAFP. [facility] and [place], [place]   11/14/2020, 8:51 AM

## 2020-11-25 ENCOUNTER — Inpatient Hospital Stay (HOSPITAL_COMMUNITY): Payer: No Typology Code available for payment source

## 2020-11-25 ENCOUNTER — Other Ambulatory Visit: Payer: Self-pay

## 2020-11-25 ENCOUNTER — Encounter (HOSPITAL_COMMUNITY): Payer: Self-pay | Admitting: Obstetrics and Gynecology

## 2020-11-25 ENCOUNTER — Inpatient Hospital Stay (HOSPITAL_COMMUNITY)
Admission: AD | Admit: 2020-11-25 | Discharge: 2020-11-25 | Disposition: A | Discharge status: Home / Self Care | Payer: No Typology Code available for payment source | Attending: Obstetrics and Gynecology | Admitting: Obstetrics and Gynecology

## 2020-11-25 DIAGNOSIS — O26891 Other specified pregnancy related conditions, first trimester: Secondary | ICD-10-CM | POA: Insufficient documentation

## 2020-11-25 DIAGNOSIS — R109 Unspecified abdominal pain: Secondary | ICD-10-CM

## 2020-11-25 DIAGNOSIS — O3411 Maternal care for benign tumor of corpus uteri, first trimester: Secondary | ICD-10-CM | POA: Insufficient documentation

## 2020-11-25 DIAGNOSIS — Z3A01 Less than 8 weeks gestation of pregnancy: Secondary | ICD-10-CM | POA: Diagnosis not present

## 2020-11-25 DIAGNOSIS — Z886 Allergy status to analgesic agent status: Secondary | ICD-10-CM | POA: Insufficient documentation

## 2020-11-25 DIAGNOSIS — O26899 Other specified pregnancy related conditions, unspecified trimester: Secondary | ICD-10-CM

## 2020-11-25 DIAGNOSIS — Z3491 Encounter for supervision of normal pregnancy, unspecified, first trimester: Secondary | ICD-10-CM

## 2020-11-25 LAB — CBC
HCT: 36.5 % (ref 36.0–46.0)
Hemoglobin: 12.6 g/dL (ref 12.0–15.0)
MCH: 32.6 pg (ref 26.0–34.0)
MCHC: 34.5 g/dL (ref 30.0–36.0)
MCV: 94.6 fL (ref 80.0–100.0)
Platelets: 309 10*3/uL (ref 150–400)
RBC: 3.86 MIL/uL — ABNORMAL LOW (ref 3.87–5.11)
RDW: 13.2 % (ref 11.5–15.5)
WBC: 7.2 10*3/uL (ref 4.0–10.5)
nRBC: 0 % (ref 0.0–0.2)

## 2020-11-25 LAB — HCG, QUANTITATIVE, PREGNANCY: hCG, Beta Chain, Quant, S: 22065 m[IU]/mL — ABNORMAL HIGH (ref <5)

## 2020-11-25 NOTE — Discharge Instructions (Signed)

## 2020-11-25 NOTE — MAU Provider Note (Signed)
History     [name]: [id number]  Arrival date and time: 11/25/20 1826   Event Date/Time   First Provider Initiated Contact with Patient 11/25/20 2053      Chief Complaint  Patient presents with  . Vaginal Bleeding  . Abdominal Pain   HPI Grace Campbell is a 30 y.o. G4P1021 at [redacted]w[redacted]d who presents with cramping. She states the cramping started this morning and was really bad. She states it has since subsided to a more bearable level. She reports she passed a clot or mucous like substance. She denies any bleeding. She states she had an ultrasound in the office 2 weeks ago (5/11) that showed 2 sacs, one empty and one with a fetal pole.   OB History    Gravida  4   Para  1   Term  1   Preterm      AB  2   Living  1     SAB  1   IAB  1   Ectopic      Multiple      Live Births  1           Past Medical History:  Diagnosis Date  . Allergy   . Anemia    history of  . Anxiety   . Arthritis   . Asthma   . Chicken pox   . Colitis   . Depression    has been followed by psych in the past, Dr. Rolm Gala.   . Fibroids    had lupron injections, pt to have myomectomy  . GERD (gastroesophageal reflux disease)   . H. pylori infection 2013  . Lactose intolerance   . Migraines   . Salmonella     Past Surgical History:  Procedure Laterality Date  . ABLATION ON ENDOMETRIOSIS N/A 03/25/2016   Procedure: EXCISION AND ABLATION OF ENDOMETRIOSIS;  Surgeon: Governor Specking, MD;  Location: [facility];  Service: Gynecology;  Laterality: N/A;  . COLONOSCOPY W/ BIOPSIES  2017  . LAPAROSCOPIC GELPORT ASSISTED MYOMECTOMY N/A 03/25/2016   Procedure: LAPAROSCOPIC GELPORT ASSISTED MYOMECTOMY;  Surgeon: Governor Specking, MD;  Location: [facility];  Service: Gynecology;  Laterality: N/A;  . wisdom teeth extracted  2008, 2011    Family History  Problem Relation Age of Onset  . Colon cancer Maternal Aunt   . Alcohol abuse Maternal Grandfather   . Diabetes Paternal Grandmother   . Alcohol  abuse Maternal Uncle   . Esophageal cancer Neg Hx   . Rectal cancer Neg Hx   . Stomach cancer Neg Hx     Social History   Tobacco Use  . Smoking status: Never Smoker  . Smokeless tobacco: Never Used  Vaping Use  . Vaping Use: Never used  Substance Use Topics  . Alcohol use: Yes    Alcohol/week: 1.0 standard drink    Types: 1 Glasses of wine per week  . Drug use: No    Allergies:  Allergies  Allergen Reactions  . Prozac [Fluoxetine Hcl] Other (See Comments)    Suicidal thoughts  . Ibuprofen Other (See Comments)    Causes ulcers GI UPSET   . Latex     Medications Prior to Admission  Medication Sig Dispense Refill Last Dose  . metroNIDAZOLE (FLAGYL) 250 MG tablet Take 250 mg by mouth 2 (two) times daily.   11/24/2020  . fluticasone (FLONASE) 50 MCG/ACT nasal spray Place 1-2 sprays into both nostrils daily. 16 g 0 More than a month at Unknown  time    Review of Systems  Constitutional: Negative.  Negative for fatigue and fever.  HENT: Negative.   Respiratory: Negative.  Negative for shortness of breath.   Cardiovascular: Negative.  Negative for chest pain.  Gastrointestinal: Positive for abdominal pain. Negative for constipation, diarrhea, nausea and vomiting.  Genitourinary: Negative.  Negative for dysuria, vaginal bleeding and vaginal discharge.  Neurological: Negative.  Negative for dizziness and headaches.   Physical Exam   Blood pressure 132/89, pulse 87, temperature 98.2 F (36.8 C), temperature source Oral, resp. rate 17, height 5' 5.5" (1.664 m), weight 65.9 kg, last menstrual period 09/22/2020, SpO2 100 %, currently breastfeeding.  Physical Exam Vitals and nursing note reviewed.  Constitutional:      General: She is not in acute distress.    Appearance: She is well-developed.  HENT:     Head: Normocephalic.  Eyes:     Pupils: Pupils are equal, round, and reactive to light.  Cardiovascular:     Rate and Rhythm: Normal rate and regular rhythm.     Heart  sounds: Normal heart sounds.  Pulmonary:     Effort: Pulmonary effort is normal. No respiratory distress.     Breath sounds: Normal breath sounds.  Abdominal:     General: Bowel sounds are normal. There is no distension.     Palpations: Abdomen is soft.     Tenderness: There is no abdominal tenderness.  Skin:    General: Skin is warm and dry.  Neurological:     Mental Status: She is alert and oriented to person, place, and time.  Psychiatric:        Behavior: Behavior normal.        Thought Content: Thought content normal.        Judgment: Judgment normal.     MAU Course  Procedures Results for orders placed or performed during the hospital encounter of 11/25/20 (from the past 24 hour(s))  CBC     Status: Abnormal   Collection Time: 11/25/20  7:20 PM  Result Value Ref Range   WBC 7.2 4.0 - 10.5 K/uL   RBC 3.86 (L) 3.87 - 5.11 MIL/uL   Hemoglobin 12.6 12.0 - 15.0 g/dL   HCT 36.5 36.0 - 46.0 %   MCV 94.6 80.0 - 100.0 fL   MCH 32.6 26.0 - 34.0 pg   MCHC 34.5 30.0 - 36.0 g/dL   RDW 13.2 11.5 - 15.5 %   Platelets 309 150 - 400 K/uL   nRBC 0.0 0.0 - 0.2 %  hCG, quantitative, pregnancy     Status: Abnormal   Collection Time: 11/25/20  7:20 PM  Result Value Ref Range   hCG, Beta Chain, Quant, S 22,065 (H) <5 mIU/mL   US OB Comp AddL Gest Less 14 Wks  Result Date: 11/25/2020 CLINICAL DATA:  30 year old pregnant female with abdominal pain and cramping. LMP: 09/22/2020 corresponding to an estimated gestational age of [redacted] weeks, 1 day. EXAM: TWIN OBSTETRIC <14WK [place] AND TRANSVAGINAL OB US COMPARISON:  None. FINDINGS: Number of IUPs:  2 Chorionicity/Amnionicity:  Dichorionic-diamniotic (thick membrane) TWIN 1 Yolk sac:  Not identified with certainty. Embryo: An echogenic structure may represent a fetal pole or calcified yolk sac. If this echogenic structure is a fetal pole the estimated gestational age based on crown-rump length of 6 mm is 6 weeks, 2 days with EDC of 07/19/2021. A large  sac-like structure within sac A may represent the amniotic sac or a large yolk sac. The estimated gestational age  based on mean sac diameter of 26 mm is 7 weeks, 4 days. Cardiac Activity: Not detected Heart Rate: 0 bpm TWIN 2 Yolk sac:  Seen Embryo: Not identified with certainty. A linear structure adjacent to the yolk sac may represent the vitelline duct. A faintly seen amniotic sac may be present. Cardiac Activity: N/A MSD: 17 mm   6 w   4 d Subchorionic hemorrhage:  None visualized. Maternal uterus/adnexae: There are multiple rim calcified uterine fibroids. There is a 3.9 x 4.3 x 3.4 cm right fundal fibroid, a 2.1 x 2.2 x 1.8 cm left fundal fibroid, and a 2.2 x 2.1 x 2.4 cm anterior fundal fibroid and a 3.4 x 3.3 x 3.7 cm right anterior fundal fibroid. The ovaries are unremarkable as visualized. IMPRESSION: 1. Two intrauterine gestational sacs as described above. An echogenic structure in sac A may represent a fetal pole or a calcified yolk sac. No fetal pole identified in the sac B. clinical correlation and close follow-up with ultrasound in 7-11 days, or earlier if clinically indicated, recommended. 2. Multiple uterine fibroids. Electronically Signed   By: Anner Crete M.D.   On: 11/25/2020 20:47   US OB LESS THAN 14 WEEKS WITH OB TRANSVAGINAL  Result Date: 11/25/2020 CLINICAL DATA:  31 year old pregnant female with abdominal pain and cramping. LMP: 09/22/2020 corresponding to an estimated gestational age of 110 weeks, 1 day. EXAM: TWIN OBSTETRIC <14WK [place] AND TRANSVAGINAL OB US COMPARISON:  None. FINDINGS: Number of IUPs:  2 Chorionicity/Amnionicity:  Dichorionic-diamniotic (thick membrane) TWIN 1 Yolk sac:  Not identified with certainty. Embryo: An echogenic structure may represent a fetal pole or calcified yolk sac. If this echogenic structure is a fetal pole the estimated gestational age based on crown-rump length of 6 mm is 6 weeks, 2 days with EDC of 07/19/2021. A large sac-like structure within sac  A may represent the amniotic sac or a large yolk sac. The estimated gestational age based on mean sac diameter of 26 mm is 7 weeks, 4 days. Cardiac Activity: Not detected Heart Rate: 0 bpm TWIN 2 Yolk sac:  Seen Embryo: Not identified with certainty. A linear structure adjacent to the yolk sac may represent the vitelline duct. A faintly seen amniotic sac may be present. Cardiac Activity: N/A MSD: 17 mm   6 w   4 d Subchorionic hemorrhage:  None visualized. Maternal uterus/adnexae: There are multiple rim calcified uterine fibroids. There is a 3.9 x 4.3 x 3.4 cm right fundal fibroid, a 2.1 x 2.2 x 1.8 cm left fundal fibroid, and a 2.2 x 2.1 x 2.4 cm anterior fundal fibroid and a 3.4 x 3.3 x 3.7 cm right anterior fundal fibroid. The ovaries are unremarkable as visualized. IMPRESSION: 1. Two intrauterine gestational sacs as described above. An echogenic structure in sac A may represent a fetal pole or a calcified yolk sac. No fetal pole identified in the sac B. clinical correlation and close follow-up with ultrasound in 7-11 days, or earlier if clinically indicated, recommended. 2. Multiple uterine fibroids. Electronically Signed   By: Anner Crete M.D.   On: 11/25/2020 20:47   MDM Per review of notes from the office- 5/11 ultrasound showed 2 gestations one with a yolk sac and fetal pole measuring [redacted]w[redacted]d without cardiac activity and one sac with only a yolk sac  CBC, HCG ABO/Rh- A Pos US OB Comp Less 14 weeks with Transvaginal Patient declines swabs, states they were done in the office already  Consulted with Dr. Elonda Husky- recommends that  patient keeps outpatient follow up and precautions given regarding likely non viable pregnancy.  Results and images reviewed at length with patient. Patient verbalized understanding and is agreeable to plan of care. Patient denies any pain at this time.   Assessment and Plan   1. Normal intrauterine pregnancy on prenatal ultrasound in first trimester   2. Abdominal pain  affecting pregnancy   3. [redacted] weeks gestation of pregnancy    -Discharge home in stable condition -SAB precautions discussed -Patient advised to follow-up with OB on Thursday as scheduled for prenatal care -Patient may return to MAU as needed or if her condition were to change or worsen   Wende Mott CNM 11/25/2020, 8:54 PM

## 2020-11-25 NOTE — MAU Note (Signed)
Was cramping really bad this morning.  Passed what looked like a clot or mucous substance. Called the office this morning.  Was told on her last [place] on 5/11 2 fetal sacs one was empty, one had a fetal pole, there was subchorionic hemorrhage noted.  Couldn't get an [place] in the office today, so was told to come here. Had since started spotting.

## 2020-11-27 ENCOUNTER — Encounter (HOSPITAL_COMMUNITY): Payer: Self-pay | Admitting: Obstetrics and Gynecology

## 2020-11-27 ENCOUNTER — Inpatient Hospital Stay (HOSPITAL_COMMUNITY): Payer: No Typology Code available for payment source

## 2020-11-27 ENCOUNTER — Other Ambulatory Visit: Payer: Self-pay

## 2020-11-27 ENCOUNTER — Inpatient Hospital Stay (HOSPITAL_COMMUNITY)
Admission: AD | Admit: 2020-11-27 | Discharge: 2020-11-27 | Disposition: A | Discharge status: Home / Self Care | Payer: No Typology Code available for payment source | Attending: Obstetrics and Gynecology | Admitting: Obstetrics and Gynecology

## 2020-11-27 DIAGNOSIS — O039 Complete or unspecified spontaneous abortion without complication: Secondary | ICD-10-CM | POA: Insufficient documentation

## 2020-11-27 DIAGNOSIS — Z3A09 9 weeks gestation of pregnancy: Secondary | ICD-10-CM

## 2020-11-27 DIAGNOSIS — O209 Hemorrhage in early pregnancy, unspecified: Secondary | ICD-10-CM | POA: Diagnosis present

## 2020-11-27 DIAGNOSIS — Z886 Allergy status to analgesic agent status: Secondary | ICD-10-CM | POA: Diagnosis not present

## 2020-11-27 LAB — CBC
HCT: 34 % — ABNORMAL LOW (ref 36.0–46.0)
Hemoglobin: 11.5 g/dL — ABNORMAL LOW (ref 12.0–15.0)
MCH: 31.7 pg (ref 26.0–34.0)
MCHC: 33.8 g/dL (ref 30.0–36.0)
MCV: 93.7 fL (ref 80.0–100.0)
Platelets: 293 10*3/uL (ref 150–400)
RBC: 3.63 MIL/uL — ABNORMAL LOW (ref 3.87–5.11)
RDW: 13.1 % (ref 11.5–15.5)
WBC: 7.1 10*3/uL (ref 4.0–10.5)
nRBC: 0 % (ref 0.0–0.2)

## 2020-11-27 LAB — URINALYSIS, ROUTINE W REFLEX MICROSCOPIC
Bacteria, UA: NONE SEEN
Bilirubin Urine: NEGATIVE
Glucose, UA: NEGATIVE mg/dL
Ketones, ur: NEGATIVE mg/dL
Leukocytes,Ua: NEGATIVE
Nitrite: NEGATIVE
Protein, ur: 30 mg/dL — AB
RBC / HPF: >50 RBC/hpf — ABNORMAL HIGH (ref 0–5)
Specific Gravity, Urine: 1.029 (ref 1.005–1.030)
pH: 6 (ref 5.0–8.0)

## 2020-11-27 MED ORDER — OXYCODONE-ACETAMINOPHEN 5-325 MG PO TABS: 1.0000 | ORAL_TABLET | Freq: Four times a day (QID) | ORAL | 0 refills | Status: AC | PRN

## 2020-11-27 MED ORDER — HYDROMORPHONE HCL 1 MG/ML IJ SOLN
0.5000 mg | Freq: Once | INTRAMUSCULAR | Status: AC
  Administered 2020-11-27: 0.5 mg via INTRAMUSCULAR
  Filled 2020-11-27: qty 1

## 2020-11-27 MED ORDER — CYCLOBENZAPRINE HCL 10 MG PO TABS: 10.0000 mg | ORAL_TABLET | Freq: Two times a day (BID) | ORAL | 0 refills | Status: AC | PRN

## 2020-11-27 NOTE — MAU Provider Note (Signed)
History     [name]: [id number]  Arrival date and time: 11/27/20 7062   Event Date/Time   First Provider Initiated Contact with Patient 11/27/20 [phone]      Chief Complaint  Patient presents with  . Vaginal Bleeding  . Abdominal Pain   HPI Grace Campbell is a 30 y.o. G3T5176 at [redacted]w[redacted]d who presents with vaginal bleeding. She reports over the last 24 hours, she has been bleeding and cramping. She reports an episode of bleeding that was heavier than a period and has since resolved. She states she passed several clots during that time as well. She reports the cramping is a 7/10 and has not tried anything for the pain.   OB History    Gravida  4   Para  1   Term  1   Preterm      AB  2   Living  1     SAB  1   IAB  1   Ectopic      Multiple      Live Births  1           Past Medical History:  Diagnosis Date  . Allergy   . Anemia    history of  . Anxiety   . Arthritis   . Asthma   . Chicken pox   . Colitis   . Depression    has been followed by psych in the past, Dr. Rolm Gala.   . Fibroids    had lupron injections, pt to have myomectomy  . GERD (gastroesophageal reflux disease)   . H. pylori infection 2013  . Lactose intolerance   . Migraines   . Salmonella     Past Surgical History:  Procedure Laterality Date  . ABLATION ON ENDOMETRIOSIS N/A 03/25/2016   Procedure: EXCISION AND ABLATION OF ENDOMETRIOSIS;  Surgeon: Governor Specking, MD;  Location: [facility];  Service: Gynecology;  Laterality: N/A;  . COLONOSCOPY W/ BIOPSIES  2017  . LAPAROSCOPIC GELPORT ASSISTED MYOMECTOMY N/A 03/25/2016   Procedure: LAPAROSCOPIC GELPORT ASSISTED MYOMECTOMY;  Surgeon: Governor Specking, MD;  Location: [facility];  Service: Gynecology;  Laterality: N/A;  . wisdom teeth extracted  2008, 2011    Family History  Problem Relation Age of Onset  . Colon cancer Maternal Aunt   . Alcohol abuse Maternal Grandfather   . Diabetes Paternal Grandmother   . Alcohol abuse Maternal Uncle    . Esophageal cancer Neg Hx   . Rectal cancer Neg Hx   . Stomach cancer Neg Hx     Social History   Tobacco Use  . Smoking status: Never Smoker  . Smokeless tobacco: Never Used  Vaping Use  . Vaping Use: Never used  Substance Use Topics  . Alcohol use: Yes    Alcohol/week: 1.0 standard drink    Types: 1 Glasses of wine per week  . Drug use: No    Allergies:  Allergies  Allergen Reactions  . Prozac [Fluoxetine Hcl] Other (See Comments)    Suicidal thoughts  . Ibuprofen Other (See Comments)    Causes ulcers GI UPSET   . Latex     Medications Prior to Admission  Medication Sig Dispense Refill Last Dose  . fluticasone (FLONASE) 50 MCG/ACT nasal spray Place 1-2 sprays into both nostrils daily. 16 g 0   . metroNIDAZOLE (FLAGYL) 250 MG tablet Take 250 mg by mouth 2 (two) times daily.       Review of Systems  Constitutional: Negative.  Negative for fatigue and fever.  HENT: Negative.   Respiratory: Negative.  Negative for shortness of breath.   Cardiovascular: Negative.  Negative for chest pain.  Gastrointestinal: Positive for abdominal pain. Negative for constipation, diarrhea, nausea and vomiting.  Genitourinary: Positive for vaginal bleeding. Negative for dysuria and vaginal discharge.  Neurological: Negative.  Negative for dizziness and headaches.   Physical Exam   Blood pressure 123/75, pulse 86, temperature 98.9 F (37.2 C), temperature source Oral, resp. rate 16, height 5' 5.5" (1.664 m), weight 65.3 kg, last menstrual period 09/22/2020, SpO2 98 %, currently breastfeeding.  Physical Exam Vitals and nursing note reviewed.  Constitutional:      General: She is not in acute distress.    Appearance: She is well-developed.  HENT:     Head: Normocephalic.  Eyes:     Pupils: Pupils are equal, round, and reactive to light.  Cardiovascular:     Rate and Rhythm: Normal rate and regular rhythm.     Heart sounds: Normal heart sounds.  Pulmonary:     Effort:  Pulmonary effort is normal. No respiratory distress.     Breath sounds: Normal breath sounds.  Abdominal:     General: Bowel sounds are normal. There is no distension.     Palpations: Abdomen is soft.     Tenderness: There is no abdominal tenderness.  Genitourinary:    Comments: SSE: Small amount of bright red bleeding in vault. One small clot removed. Skin:    General: Skin is warm and dry.  Neurological:     Mental Status: She is alert and oriented to person, place, and time.  Psychiatric:        Behavior: Behavior normal.        Thought Content: Thought content normal.        Judgment: Judgment normal.     MAU Course  Procedures Results for orders placed or performed during the hospital encounter of 11/27/20 (from the past 24 hour(s))  Urinalysis, Routine w reflex microscopic Urine, Clean Catch     Status: Abnormal   Collection Time: 11/27/20  9:08 AM  Result Value Ref Range   Color, Urine YELLOW YELLOW   APPearance CLOUDY (A) CLEAR   Specific Gravity, Urine 1.029 1.005 - 1.030   pH 6.0 5.0 - 8.0   Glucose, UA NEGATIVE NEGATIVE mg/dL   Hgb urine dipstick LARGE (A) NEGATIVE   Bilirubin Urine NEGATIVE NEGATIVE   Ketones, ur NEGATIVE NEGATIVE mg/dL   Protein, ur 30 (A) NEGATIVE mg/dL   Nitrite NEGATIVE NEGATIVE   Leukocytes,Ua NEGATIVE NEGATIVE   RBC / HPF >50 (H) 0 - 5 RBC/hpf   WBC, UA 0-5 0 - 5 WBC/hpf   Bacteria, UA NONE SEEN NONE SEEN   Squamous Epithelial / LPF 0-5 0 - 5   Mucus PRESENT   CBC     Status: Abnormal   Collection Time: 11/27/20  9:35 AM  Result Value Ref Range   WBC 7.1 4.0 - 10.5 K/uL   RBC 3.63 (L) 3.87 - 5.11 MIL/uL   Hemoglobin 11.5 (L) 12.0 - 15.0 g/dL   HCT 34.0 (L) 36.0 - 46.0 %   MCV 93.7 80.0 - 100.0 fL   MCH 31.7 26.0 - 34.0 pg   MCHC 33.8 30.0 - 36.0 g/dL   RDW 13.1 11.5 - 15.5 %   Platelets 293 150 - 400 K/uL   nRBC 0.0 0.0 - 0.2 %   US OB Transvaginal  Result Date: 11/27/2020 CLINICAL DATA:  Vaginal  bleeding in first trimester  of pregnancy EXAM: TRANSVAGINAL OB ULTRASOUND TECHNIQUE: Transvaginal ultrasound was performed for complete evaluation of the gestation as well as the maternal uterus, adnexal regions, and pelvic cul-de-sac. COMPARISON:  11/25/2020 FINDINGS: Intrauterine gestational sac: Absent; twin gestational sacs seen on previous exam no longer identified. Yolk sac:  N/A Embryo:  N/A Cardiac Activity: N/A Heart Rate: N/A bpm MSD:   mm    w     d CRL:     mm    w  d                  [place] EDC: Subchorionic hemorrhage:  N/A Maternal uterus/adnexae: Uterus anteverted, containing several calcified leiomyomata. Diffuse enlargement of endometrial complex up to 26 mm diameter, questionably containing blood. Unremarkable ovaries. No free pelvic fluid or adnexal masses. IMPRESSION: Previously identified twin gestational sacs are no longer seen consistent with spontaneous abortion. Thickened endometrial complex question containing blood. Calcified uterine leiomyomata. Electronically Signed   By: Lavonia Dana M.D.   On: 11/27/2020 11:00    MDM UA CBC US OB Transvaginal  Results reviewed with patient at length. Management options reviewed and patient desires expectant management. Warning signs for pain and bleeding reviewed at length. Patient desires to follow up with [facility], message sent.   Assessment and Plan   1. Miscarriage   2. [redacted] weeks gestation of pregnancy    -Discharge home in stable condition -Rx for percocet and flexeril sent to patient' pharmacy -Vaginal bleeding and pain precautions discussed -Patient advised to follow-up with OB in 1 week for repeat HCG, message sent -Patient may return to MAU as needed or if her condition were to change or worsen   Wende Mott CNM 11/27/2020, 9:22 AM

## 2020-11-27 NOTE — MAU Note (Signed)
Grace Campbell is a 30 y.o. at [redacted]w[redacted]d here in MAU reporting: worsening bleeding since yesterday. States she is changing a pad every hour but it is not saturated. Saw a few clots yesterday but none today. Having lower abdominal pain.  Onset of complaint: ongoing  Pain score: 7/10  Vitals:   11/27/20 0906  BP: 123/75  Pulse: 86  Resp: 16  Temp: 98.9 F (37.2 C)  SpO2: 98%     Lab orders placed from triage: UA

## 2020-11-27 NOTE — Discharge Instructions (Signed)
Miscarriage A miscarriage is the loss of pregnancy before the 20th week. Most miscarriages happen during the first 3 months of pregnancy. Sometimes, a miscarriage can happen before a woman knows that she is pregnant. Having a miscarriage can be an emotional experience. If you have had a miscarriage, talk with your health care provider about any questions you may have about the loss of your baby, the grieving process, and your plans for future pregnancy. What are the causes? Many times, the cause of a miscarriage is not known. What increases the risk? The following factors may make a pregnant woman more likely to have a miscarriage: Certain medical conditions  Conditions that affect the hormone balance in the body, such as thyroid disease or polycystic ovary syndrome.  Diabetes.  Autoimmune disorders.  Infections.  Bleeding disorders.  Obesity. Lifestyle factors  Using products with tobacco or nicotine in them or being exposed to tobacco smoke.  Having alcohol.  Having large amounts of caffeine.  Recreational drug use. Problems with reproductive organs or structures  Cervical insufficiency. This is when the lowest part of the uterus (cervix) opens and thins before pregnancy is at term.  Having a condition called Asherman syndrome. This syndrome causes scarring in the uterus or causes the uterus to be abnormal in structure.  Fibrous growths, called fibroids, in the uterus.  Congenital abnormalities. These problems are present at birth.  Infection of the cervix or uterus. Personal or medical history  Injury (trauma).  Having had a miscarriage before.  Being younger than age 79 or older than age 66.  Exposure to harmful substances in the environment. This may include radiation or heavy metals, such as lead.  Use of certain medicines. What are the signs or symptoms? Symptoms of this condition include:  Vaginal bleeding or spotting, with or without cramps or  pain.  Pain or cramping in the abdomen or lower back.  Fluid or tissue coming out of the vagina. How is this diagnosed? This condition may be diagnosed based on:  A physical exam.  Ultrasound.  Lab tests, such as blood tests, urine tests, or swabs for infection. How is this treated? Treatment for a miscarriage is sometimes not needed if all the pregnancy tissue that was in the uterus comes out on its own, and there are no other problems such as infection or heavy bleeding. In other cases, this condition may be treated with:  Dilation and curettage (D&C). In this procedure, the cervix is stretched open and any remaining pregnancy tissue is removed from the lining of the uterus (endometrium).  Medicines. These may include: ? Antibiotic medicine, to treat infection. ? Medicine to help any remaining pregnancy tissue come out of the body. ? Medicine to reduce (contract) the size of the uterus. These medicines may be given if there is a lot of bleeding. If you have Rh-negative blood, you may be given an injection of a medicine called Rho(D) immune globulin. This medicine helps prevent problems with future pregnancies. Follow these instructions at home: Medicines  Take over-the-counter and prescription medicines only as told by your health care provider.  If you were prescribed antibiotic medicine, take it as told by your health care provider. Do not stop taking the antibiotic even if you start to feel better. Activity  Rest as told by your health care provider. Ask your health care provider what activities are safe for you.  Have someone help with home and family responsibilities during this time. General instructions  Monitor how much tissue  or blood clot material comes out of the vagina.  Do not have sex, douche, or put anything, such as tampons, in your vagina until your health care provider says it is okay.  To help you and your partner with the grieving process, talk with your  health care provider or get counseling.  When you are ready, meet with your health care provider to discuss any important steps you should take for your health. Also, discuss steps you should take to have a healthy pregnancy in the future.  Keep all follow-up visits. This is important.   Where to find more information  The [employer]: acog.org  [place] Department of Health and [employer], systems of Women's Health: EverydayCos[employer].no Contact a health care provider if:  You have a fever or chills.  There is bad-smelling fluid coming from the vagina.  You have more bleeding instead of less.  Tissue or blood clots come out of your vagina. Get help right away if:  You have severe cramps or pain in your back or abdomen.  Heavy bleeding soaks through 2 large sanitary pads an hour for more than 2 hours.  You become light-headed or weak.  You faint.  You feel sad, and your sadness takes over your thoughts.  You think about hurting yourself. If you ever feel like you may hurt yourself or others, or have thoughts about taking your own life, get help right away. Go to your nearest emergency department or:  Call your local emergency services (911 in the [place]  Call a suicide crisis helpline, such as the [place] at (806)689-2099. This is open 24 hours a day in the [place]  Text the Crisis Text Line at 9345283261 (in the [place] Summary  Most miscarriages happen in the first 3 months of pregnancy. Sometimes miscarriage happens before a woman knows that she is pregnant.  Follow instructions from your health care provider about medicines and activity.  To help you and your partner with grieving, talk with your health care provider or get counseling.  Keep all follow-up visits. This information is not intended to replace advice given to you by your health care provider. Make sure you discuss any questions you  have with your health care provider. Document Revised: 12/22/2019 Document Reviewed: 12/22/2019 Elsevier Patient Education  [place].

## 2020-12-16 ENCOUNTER — Other Ambulatory Visit: Payer: Self-pay

## 2020-12-16 ENCOUNTER — Emergency Department (HOSPITAL_COMMUNITY)
Admission: AD | Admit: 2020-12-16 | Discharge: 2020-12-16 | Discharge status: Against Medical Advice | Payer: No Typology Code available for payment source | Attending: Obstetrics & Gynecology | Admitting: Obstetrics & Gynecology

## 2020-12-16 ENCOUNTER — Encounter (HOSPITAL_COMMUNITY): Payer: Self-pay | Admitting: Obstetrics & Gynecology

## 2020-12-16 DIAGNOSIS — R109 Unspecified abdominal pain: Secondary | ICD-10-CM | POA: Diagnosis present

## 2020-12-16 DIAGNOSIS — Z3202 Encounter for pregnancy test, result negative: Secondary | ICD-10-CM

## 2020-12-16 DIAGNOSIS — Z5321 Procedure and treatment not carried out due to patient leaving prior to being seen by health care provider: Secondary | ICD-10-CM | POA: Insufficient documentation

## 2020-12-16 DIAGNOSIS — R519 Headache, unspecified: Secondary | ICD-10-CM | POA: Diagnosis not present

## 2020-12-16 NOTE — ED Notes (Signed)
Patient called x3 with no response

## 2020-12-16 NOTE — MAU Note (Signed)
Has not had follow up.  Office had rescheduled appt, hasn't had it yet.  Is wanting to change office. (Urine Pregnancy test here today is negative)

## 2020-12-16 NOTE — MAU Provider Note (Signed)
Event Date/Time   First Provider Initiated Contact with Patient 12/16/20 1505      S Ms. Grace Campbell is a 30 y.o. [phone] patient who presents to MAU today with complaint of right-sided pain.  Patient reports she called [facility] and even though she told them she was not pregnant, she was told to come to MAU for evaluation. Patient reports SAB on 11/27/2020. UPT negative in MAU. Patient reports this weekend she started having mild RLQ pain that got significantly worse today. Patient identifies specific point on RLQ that is tender to touch. Patient reports the pain has spread to her entire right side, including her arms and legs. Patient denies numbness, tingling or loss of function of right side. Patient also reports she is starting to get a headache.  O BP 125/81 (BP Location: Right Arm)   Pulse 94   Temp 98.8 F (37.1 C) (Oral)   Resp 16   Ht 5' 5.5" (1.664 m)   Wt 66.7 kg   SpO2 100%   Breastfeeding Unknown   BMI 24.09 kg/m   Patient Vitals for the past 24 hrs:  BP Temp Temp src Pulse Resp SpO2 Height Weight  12/16/20 1440 125/81 98.8 F (37.1 C) Oral 94 16 100 % 5' 5.5" (1.664 m) 66.7 kg   Physical Exam Vitals and nursing note reviewed.  Constitutional:      General: She is not in acute distress.    Appearance: Normal appearance. She is not ill-appearing, toxic-appearing or diaphoretic.  HENT:     Head: Normocephalic and atraumatic.  Pulmonary:     Effort: Pulmonary effort is normal.  Skin:    General: Skin is warm and dry.  Neurological:     General: No focal deficit present.     Mental Status: She is alert and oriented to person, place, and time.     Cranial Nerves: No dysarthria or facial asymmetry.     Sensory: Sensation is intact.     Motor: Motor function is intact. No weakness, tremor or abnormal muscle tone.     Gait: Gait is intact.  Psychiatric:        Mood and Affect: Mood normal.        Behavior: Behavior normal.        Thought Content: Thought  content normal.        Judgment: Judgment normal.    A Medical screening exam started UPT negative  P Discharge from MAU in stable condition Report called to Dr. Dina Rich, who accepts patient for transfer Transported to ED via wheelchair Warning signs for worsening condition that would warrant emergency follow-up discussed Patient may return to MAU as needed   Leigha Olberding, Gerrie Nordmann, NP 12/16/2020 3:37 PM

## 2020-12-16 NOTE — MAU Note (Signed)
Was here on the 25th with bleeding and cramping. Was dx with a miscarriage, had passed everything per [place].  Today has had intensified abd pain that has intensifies. No bleeding. Completely stopped prior to the weekend.  Has not taken anything for it. Nausea after eating and the pain got worse.  No vomiting.  Is constipated had BM earlier).  Getting a HA on the rt side.

## 2020-12-17 LAB — POC URINE PREG, ED: Preg Test, Ur: NEGATIVE

## 2021-01-13 ENCOUNTER — Encounter (HOSPITAL_BASED_OUTPATIENT_CLINIC_OR_DEPARTMENT_OTHER): Payer: No Typology Code available for payment source | Admitting: Obstetrics & Gynecology

## 2021-02-18 ENCOUNTER — Ambulatory Visit: Payer: No Typology Code available for payment source | Attending: Family Medicine | Admitting: Family Medicine

## 2021-02-18 ENCOUNTER — Other Ambulatory Visit: Payer: Self-pay

## 2021-03-20 ENCOUNTER — Ambulatory Visit: Payer: No Typology Code available for payment source | Admitting: Family Medicine
